# Patient Record
Sex: Female | Born: 1970 | Race: White | Marital: Single | Smoking: Former smoker
Health system: Southern US, Community
[De-identification: ages and names within clinical notes are randomized; demographics above are authoritative.]

## PROBLEM LIST (undated history)

## (undated) DIAGNOSIS — Z9889 Other specified postprocedural states: Secondary | ICD-10-CM

## (undated) DIAGNOSIS — R112 Nausea with vomiting, unspecified: Secondary | ICD-10-CM

## (undated) DIAGNOSIS — K219 Gastro-esophageal reflux disease without esophagitis: Secondary | ICD-10-CM

## (undated) DIAGNOSIS — J45909 Unspecified asthma, uncomplicated: Secondary | ICD-10-CM

## (undated) DIAGNOSIS — K5090 Crohn's disease, unspecified, without complications: Secondary | ICD-10-CM

## (undated) DIAGNOSIS — K5792 Diverticulitis of intestine, part unspecified, without perforation or abscess without bleeding: Secondary | ICD-10-CM

## (undated) DIAGNOSIS — E119 Type 2 diabetes mellitus without complications: Secondary | ICD-10-CM

## (undated) DIAGNOSIS — I10 Essential (primary) hypertension: Secondary | ICD-10-CM

## (undated) HISTORY — PX: APPENDECTOMY: SHX54

## (undated) HISTORY — PX: CHOLECYSTECTOMY: SHX55

## (undated) HISTORY — DX: Type 2 diabetes mellitus without complications: E11.9

## (undated) HISTORY — DX: Diverticulitis of intestine, part unspecified, without perforation or abscess without bleeding: K57.92

## (undated) HISTORY — PX: TONSILLECTOMY: SUR1361

## (undated) HISTORY — DX: Gastro-esophageal reflux disease without esophagitis: K21.9

## (undated) HISTORY — DX: Crohn's disease, unspecified, without complications: K50.90

---

## 2012-06-06 DIAGNOSIS — J309 Allergic rhinitis, unspecified: Secondary | ICD-10-CM | POA: Insufficient documentation

## 2012-09-09 ENCOUNTER — Ambulatory Visit: Payer: Self-pay | Admitting: Family Medicine

## 2012-10-07 DIAGNOSIS — K5732 Diverticulitis of large intestine without perforation or abscess without bleeding: Secondary | ICD-10-CM | POA: Insufficient documentation

## 2012-12-13 ENCOUNTER — Other Ambulatory Visit: Payer: Self-pay | Admitting: Gastroenterology

## 2012-12-13 DIAGNOSIS — K5000 Crohn's disease of small intestine without complications: Secondary | ICD-10-CM

## 2012-12-22 ENCOUNTER — Other Ambulatory Visit: Payer: Self-pay

## 2012-12-22 ENCOUNTER — Ambulatory Visit
Admission: RE | Admit: 2012-12-22 | Discharge: 2012-12-22 | Disposition: A | Discharge status: Home / Self Care | Payer: Managed Care, Other (non HMO) | Source: Ambulatory Visit | Attending: Gastroenterology | Admitting: Gastroenterology

## 2012-12-22 DIAGNOSIS — K5000 Crohn's disease of small intestine without complications: Secondary | ICD-10-CM

## 2012-12-22 MED ORDER — IOHEXOL 300 MG/ML  SOLN
100.0000 mL | Freq: Once | INTRAMUSCULAR | Status: AC | PRN
  Administered 2012-12-22: 100 mL via INTRAVENOUS

## 2013-01-02 ENCOUNTER — Ambulatory Visit (INDEPENDENT_AMBULATORY_CARE_PROVIDER_SITE_OTHER): Payer: Managed Care, Other (non HMO) | Admitting: Family Medicine

## 2013-01-02 ENCOUNTER — Encounter: Payer: Self-pay | Admitting: Family Medicine

## 2013-01-02 VITALS — BP 140/95 | HR 78 | Ht 67.0 in | Wt 200.0 lb

## 2013-01-02 DIAGNOSIS — K5732 Diverticulitis of large intestine without perforation or abscess without bleeding: Secondary | ICD-10-CM

## 2013-01-02 DIAGNOSIS — T8332XA Displacement of intrauterine contraceptive device, initial encounter: Secondary | ICD-10-CM | POA: Insufficient documentation

## 2013-01-02 DIAGNOSIS — K5090 Crohn's disease, unspecified, without complications: Secondary | ICD-10-CM | POA: Insufficient documentation

## 2013-01-02 DIAGNOSIS — T8332XS Displacement of intrauterine contraceptive device, sequela: Secondary | ICD-10-CM

## 2013-01-02 DIAGNOSIS — T889XXS Complication of surgical and medical care, unspecified, sequela: Secondary | ICD-10-CM

## 2013-01-02 NOTE — Assessment & Plan Note (Signed)
Will leave in place, as it is working for her and she has no complaints.  Replace with pap in 2 years.

## 2013-01-02 NOTE — Patient Instructions (Signed)
Levonorgestrel intrauterine device (IUD) What is this medicine? LEVONORGESTREL IUD (LEE voe nor jes trel) is a contraceptive (birth control) device. The device is placed inside the uterus by a healthcare professional. It is used to prevent pregnancy and can also be used to treat heavy bleeding that occurs during your period. Depending on the device, it can be used for 3 to 5 years. This medicine may be used for other purposes; ask your health care provider or pharmacist if you have questions. COMMON BRAND NAME(S): Mirena, Skyla What should I tell my health care provider before I take this medicine? They need to know if you have any of these conditions: -abnormal Pap smear -cancer of the breast, uterus, or cervix -diabetes -endometritis -genital or pelvic infection now or in the past -have more than one sexual partner or your partner has more than one partner -heart disease -history of an ectopic or tubal pregnancy -immune system problems -IUD in place -liver disease or tumor -problems with blood clots or take blood-thinners -use intravenous drugs -uterus of unusual shape -vaginal bleeding that has not been explained -an unusual or allergic reaction to levonorgestrel, other hormones, silicone, or polyethylene, medicines, foods, dyes, or preservatives -pregnant or trying to get pregnant -breast-feeding How should I use this medicine? This device is placed inside the uterus by a health care professional. Talk to your pediatrician regarding the use of this medicine in children. Special care may be needed. Overdosage: If you think you have taken too much of this medicine contact a poison control center or emergency room at once. NOTE: This medicine is only for you. Do not share this medicine with others. What if I miss a dose? This does not apply. What may interact with this medicine? Do not take this medicine with any of the following  medications: -amprenavir -bosentan -fosamprenavir This medicine may also interact with the following medications: -aprepitant -barbiturate medicines for inducing sleep or treating seizures -bexarotene -griseofulvin -medicines to treat seizures like carbamazepine, ethotoin, felbamate, oxcarbazepine, phenytoin, topiramate -modafinil -pioglitazone -rifabutin -rifampin -rifapentine -some medicines to treat HIV infection like atazanavir, indinavir, lopinavir, nelfinavir, tipranavir, ritonavir -St. John's wort -warfarin This list may not describe all possible interactions. Give your health care provider a list of all the medicines, herbs, non-prescription drugs, or dietary supplements you use. Also tell them if you smoke, drink alcohol, or use illegal drugs. Some items may interact with your medicine. What should I watch for while using this medicine? Visit your doctor or health care professional for regular check ups. See your doctor if you or your partner has sexual contact with others, becomes HIV positive, or gets a sexual transmitted disease. This product does not protect you against HIV infection (AIDS) or other sexually transmitted diseases. You can check the placement of the IUD yourself by reaching up to the top of your vagina with clean fingers to feel the threads. Do not pull on the threads. It is a good habit to check placement after each menstrual period. Call your doctor right away if you feel more of the IUD than just the threads or if you cannot feel the threads at all. The IUD may come out by itself. You may become pregnant if the device comes out. If you notice that the IUD has come out use a backup birth control method like condoms and call your health care provider. Using tampons will not change the position of the IUD and are okay to use during your period. What side effects may I   notice from receiving this medicine? Side effects that you should report to your doctor or  health care professional as soon as possible: -allergic reactions like skin rash, itching or hives, swelling of the face, lips, or tongue -fever, flu-like symptoms -genital sores -high blood pressure -no menstrual period for 6 weeks during use -pain, swelling, warmth in the leg -pelvic pain or tenderness -severe or sudden headache -signs of pregnancy -stomach cramping -sudden shortness of breath -trouble with balance, talking, or walking -unusual vaginal bleeding, discharge -yellowing of the eyes or skin Side effects that usually do not require medical attention (report to your doctor or health care professional if they continue or are bothersome): -acne -breast pain -change in sex drive or performance -changes in weight -cramping, dizziness, or faintness while the device is being inserted -headache -irregular menstrual bleeding within first 3 to 6 months of use -nausea This list may not describe all possible side effects. Call your doctor for medical advice about side effects. You may report side effects to FDA at 1-800-FDA-1088. Where should I keep my medicine? This does not apply. NOTE: This sheet is a summary. It may not cover all possible information. If you have questions about this medicine, talk to your doctor, pharmacist, or health care provider.  2014, [employer] Standard. (2011-02-05 13:54:04)  

## 2013-01-02 NOTE — Progress Notes (Signed)
   Subjective:    Patient ID: Sydney Powell, female    DOB: 1970/04/10, 42 y.o.   MRN: [id number]  HPI  New pt. Referred because she has had recent new diagnosis of crohn's disease (symtoms of new diarrhea x 6 months), improved now on Entocort.  She had CT to help confirm diagnosis.  She was found on CT to have a malpositioned IUD.  She noted no problems with IUD, prior to this.  It was placed in [place] in 2011 to help with menorrhagia.  It has, she has regular cycles but no pain.Had mammogram earlier this year.   Past Medical History  Diagnosis Date  . Crohn's disease   . Diverticulitis    Past Surgical History  Procedure Laterality Date  . Cholecystectomy    . Tonsillectomy     Family History  Problem Relation Age of Onset  . Hypertension Mother   . Diabetes Mother   . Hypothyroidism Mother   . Obesity Mother   . Diabetes Maternal Grandmother   . Cancer Maternal Grandmother     breast  . Stroke Maternal Grandmother   . Cancer Maternal Grandfather     leukemia  . Atrial fibrillation Paternal Grandmother    History   Social History  . Marital Status: Single    Spouse Name: N/A    Number of Children: N/A  . Years of Education: N/A   Occupational History  . Not on file.   Social History Main Topics  . Smoking status: Former Smoker    Quit date: 01/03/1999  . Smokeless tobacco: Never Used  . Alcohol Use: Yes     Comment: 1x/week  . Drug Use: No  . Sexual Activity: No   Other Topics Concern  . Not on file   Social History Narrative  . No narrative on file   Current outpatient prescriptions:budesonide (ENTOCORT EC) 3 MG 24 hr capsule, Take 9 mg by mouth daily., Disp: , Rfl: ;  ranitidine (ZANTAC) 150 MG tablet, Take 150 mg by mouth 2 (two) times daily., Disp: , Rfl: ;  omeprazole (PRILOSEC) 20 MG capsule, , Disp: , Rfl:    No Known Allergies     Review of Systems  Respiratory: Negative for shortness of breath.   Cardiovascular: Negative for chest pain.    Gastrointestinal: Negative for vomiting, abdominal pain and diarrhea (much improved on Entocort).  Genitourinary: Negative for dysuria, vaginal bleeding, vaginal discharge, vaginal pain, menstrual problem and pelvic pain.  Musculoskeletal: Negative for back pain.  Skin: Negative for pallor.       Objective:   Physical Exam  Vitals reviewed. Constitutional: She appears well-developed and well-nourished. No distress.  HENT:  Head: Normocephalic and atraumatic.  Eyes: No scleral icterus.  Neck: Neck supple.  Cardiovascular: Normal rate and regular rhythm.   No murmur heard. Pulmonary/Chest: Effort normal.  Abdominal: Soft. She exhibits no distension. There is no tenderness.  Genitourinary: Vagina normal and uterus normal.  IUD strings visualized and uterus is non-tender.  No adnexal mass or tenderness.          Assessment & Plan:

## 2013-04-07 DIAGNOSIS — K829 Disease of gallbladder, unspecified: Secondary | ICD-10-CM | POA: Insufficient documentation

## 2013-04-07 DIAGNOSIS — K219 Gastro-esophageal reflux disease without esophagitis: Secondary | ICD-10-CM

## 2013-04-07 HISTORY — DX: Gastro-esophageal reflux disease without esophagitis: K21.9

## 2013-04-13 DIAGNOSIS — R7303 Prediabetes: Secondary | ICD-10-CM | POA: Insufficient documentation

## 2013-08-01 DIAGNOSIS — E119 Type 2 diabetes mellitus without complications: Secondary | ICD-10-CM | POA: Insufficient documentation

## 2013-11-20 ENCOUNTER — Encounter: Payer: Self-pay | Admitting: Family Medicine

## 2014-03-27 ENCOUNTER — Ambulatory Visit (INDEPENDENT_AMBULATORY_CARE_PROVIDER_SITE_OTHER): Payer: BLUE CROSS/BLUE SHIELD | Admitting: Family Medicine

## 2014-03-27 ENCOUNTER — Encounter: Payer: Self-pay | Admitting: Family Medicine

## 2014-03-27 VITALS — BP 150/103 | HR 70 | Ht 67.0 in | Wt 214.8 lb

## 2014-03-27 DIAGNOSIS — K200 Eosinophilic esophagitis: Secondary | ICD-10-CM | POA: Insufficient documentation

## 2014-03-27 DIAGNOSIS — Z30433 Encounter for removal and reinsertion of intrauterine contraceptive device: Secondary | ICD-10-CM | POA: Diagnosis not present

## 2014-03-27 DIAGNOSIS — K5090 Crohn's disease, unspecified, without complications: Secondary | ICD-10-CM | POA: Insufficient documentation

## 2014-03-27 DIAGNOSIS — K589 Irritable bowel syndrome without diarrhea: Secondary | ICD-10-CM | POA: Insufficient documentation

## 2014-03-27 NOTE — Patient Instructions (Signed)
Levonorgestrel intrauterine device (IUD) What is this medicine? LEVONORGESTREL IUD (LEE voe nor jes trel) is a contraceptive (birth control) device. The device is placed inside the uterus by a healthcare professional. It is used to prevent pregnancy and can also be used to treat heavy bleeding that occurs during your period. Depending on the device, it can be used for 3 to 5 years. This medicine may be used for other purposes; ask your health care provider or pharmacist if you have questions. COMMON BRAND NAME(S): Verda Cumins What should I tell my health care provider before I take this medicine? They need to know if you have any of these conditions: -abnormal Pap smear -cancer of the breast, uterus, or cervix -diabetes -endometritis -genital or pelvic infection now or in the past -have more than one sexual partner or your partner has more than one partner -heart disease -history of an ectopic or tubal pregnancy -immune system problems -IUD in place -liver disease or tumor -problems with blood clots or take blood-thinners -use intravenous drugs -uterus of unusual shape -vaginal bleeding that has not been explained -an unusual or allergic reaction to levonorgestrel, other hormones, silicone, or polyethylene, medicines, foods, dyes, or preservatives -pregnant or trying to get pregnant -breast-feeding How should I use this medicine? This device is placed inside the uterus by a health care professional. Talk to your pediatrician regarding the use of this medicine in children. Special care may be needed. Overdosage: If you think you have taken too much of this medicine contact a poison control center or emergency room at once. NOTE: This medicine is only for you. Do not share this medicine with others. What if I miss a dose? This does not apply. What may interact with this medicine? Do not take this medicine with any of the following  medications: -amprenavir -bosentan -fosamprenavir This medicine may also interact with the following medications: -aprepitant -barbiturate medicines for inducing sleep or treating seizures -bexarotene -griseofulvin -medicines to treat seizures like carbamazepine, ethotoin, felbamate, oxcarbazepine, phenytoin, topiramate -modafinil -pioglitazone -rifabutin -rifampin -rifapentine -some medicines to treat HIV infection like atazanavir, indinavir, lopinavir, nelfinavir, tipranavir, ritonavir -St. John's wort -warfarin This list may not describe all possible interactions. Give your health care provider a list of all the medicines, herbs, non-prescription drugs, or dietary supplements you use. Also tell them if you smoke, drink alcohol, or use illegal drugs. Some items may interact with your medicine. What should I watch for while using this medicine? Visit your doctor or health care professional for regular check ups. See your doctor if you or your partner has sexual contact with others, becomes HIV positive, or gets a sexual transmitted disease. This product does not protect you against HIV infection (AIDS) or other sexually transmitted diseases. You can check the placement of the IUD yourself by reaching up to the top of your vagina with clean fingers to feel the threads. Do not pull on the threads. It is a good habit to check placement after each menstrual period. Call your doctor right away if you feel more of the IUD than just the threads or if you cannot feel the threads at all. The IUD may come out by itself. You may become pregnant if the device comes out. If you notice that the IUD has come out use a backup birth control method like condoms and call your health care provider. Using tampons will not change the position of the IUD and are okay to use during your period. What side effects may  I notice from receiving this medicine? Side effects that you should report to your doctor or  health care professional as soon as possible: -allergic reactions like skin rash, itching or hives, swelling of the face, lips, or tongue -fever, flu-like symptoms -genital sores -high blood pressure -no menstrual period for 6 weeks during use -pain, swelling, warmth in the leg -pelvic pain or tenderness -severe or sudden headache -signs of pregnancy -stomach cramping -sudden shortness of breath -trouble with balance, talking, or walking -unusual vaginal bleeding, discharge -yellowing of the eyes or skin Side effects that usually do not require medical attention (report to your doctor or health care professional if they continue or are bothersome): -acne -breast pain -change in sex drive or performance -changes in weight -cramping, dizziness, or faintness while the device is being inserted -headache -irregular menstrual bleeding within first 3 to 6 months of use -nausea This list may not describe all possible side effects. Call your doctor for medical advice about side effects. You may report side effects to FDA at 1-800-FDA-1088. Where should I keep my medicine? This does not apply. NOTE: This sheet is a summary. It may not cover all possible information. If you have questions about this medicine, talk to your doctor, pharmacist, or health care provider.  2015, [employer] Standard. (2011-02-05 13:54:04)

## 2014-03-27 NOTE — Progress Notes (Signed)
    Subjective: Patient here for IUD change.  Has been in for 5 years and due to be changed. She has lighter cycles and desires to continue Mirena IUD for contraception.  Objective: Filed Vitals:   03/27/14 0843  BP: 150/103  Pulse: 70    Patient identified, informed consent performed, signed copy in chart, time out was performed Procedure: Speculum placed inside vagina.  Cervix visualized.  Strings grasped with ring forceps.  IUD removed intact.  Procedure: Cervix visualized.  Cleaned with Betadine x 2.  Grasped anteriorly with a single tooth tenaculum.  Uterus sounded to 9 cm.  Mirena IUD placed per manufacturer's recommendations.  Strings trimmed to 3 cm.   Patient given post procedure instructions and Mirena care card with expiration date.  Patient is asked to check IUD strings periodically.  Impression: Encounter for IUD removal and reinsertion   Plan: Continue IUD for 5 years.

## 2014-05-08 ENCOUNTER — Encounter: Payer: Self-pay | Admitting: Family Medicine

## 2014-05-08 ENCOUNTER — Ambulatory Visit (INDEPENDENT_AMBULATORY_CARE_PROVIDER_SITE_OTHER): Payer: BLUE CROSS/BLUE SHIELD | Admitting: Family Medicine

## 2014-05-08 VITALS — BP 140/98 | HR 69 | Ht 67.0 in | Wt 219.6 lb

## 2014-05-08 DIAGNOSIS — Z124 Encounter for screening for malignant neoplasm of cervix: Secondary | ICD-10-CM

## 2014-05-08 DIAGNOSIS — Z1151 Encounter for screening for human papillomavirus (HPV): Secondary | ICD-10-CM | POA: Diagnosis not present

## 2014-05-08 DIAGNOSIS — Z01419 Encounter for gynecological examination (general) (routine) without abnormal findings: Secondary | ICD-10-CM

## 2014-05-08 DIAGNOSIS — Z30431 Encounter for routine checking of intrauterine contraceptive device: Secondary | ICD-10-CM

## 2014-05-08 NOTE — Progress Notes (Signed)
Patient has had spotting every day since Mirena Placement.

## 2014-05-08 NOTE — Progress Notes (Deleted)
    Subjective:    Patient ID: Sydney Powell is a 44 y.o. female presenting with Gynecologic Exam  on 05/08/2014  HPI:   Review of Systems    Objective:    BP 140/98 mmHg  Pulse 69  Ht 5' 7"  (1.702 m)  Wt 219 lb 9.6 oz (99.61 kg)  BMI 34.39 kg/m2 Physical Exam      Assessment & Plan:    No Follow-up on file.  Odarius Dines S 05/08/2014 1:56 PM

## 2014-05-08 NOTE — Patient Instructions (Signed)
Preventive Care for Adults A healthy lifestyle and preventive care can promote health and wellness. Preventive health guidelines for women include the following key practices.  A routine yearly physical is a good way to check with your health care provider about your health and preventive screening. It is a chance to share any concerns and updates on your health and to receive a thorough exam.  Visit your dentist for a routine exam and preventive care every 6 months. Brush your teeth twice a day and floss once a day. Good oral hygiene prevents tooth decay and gum disease.  The frequency of eye exams is based on your age, health, family medical history, use of contact lenses, and other factors. Follow your health care provider's recommendations for frequency of eye exams.  Eat a healthy diet. Foods like vegetables, fruits, whole grains, low-fat dairy products, and lean protein foods contain the nutrients you need without too many calories. Decrease your intake of foods high in solid fats, added sugars, and salt. Eat the right amount of calories for you.Get information about a proper diet from your health care provider, if necessary.  Regular physical exercise is one of the most important things you can do for your health. Most adults should get at least 150 minutes of moderate-intensity exercise (any activity that increases your heart rate and causes you to sweat) each week. In addition, most adults need muscle-strengthening exercises on 2 or more days a week.  Maintain a healthy weight. The body mass index (BMI) is a screening tool to identify possible weight problems. It provides an estimate of body fat based on height and weight. Your health care provider can find your BMI and can help you achieve or maintain a healthy weight.For adults 20 years and older:  A BMI below 18.5 is considered underweight.  A BMI of 18.5 to 24.9 is normal.  A BMI of 25 to 29.9 is considered overweight.  A BMI of  30 and above is considered obese.  Maintain normal blood lipids and cholesterol levels by exercising and minimizing your intake of saturated fat. Eat a balanced diet with plenty of fruit and vegetables. Blood tests for lipids and cholesterol should begin at age 76 and be repeated every 5 years. If your lipid or cholesterol levels are high, you are over 50, or you are at high risk for heart disease, you may need your cholesterol levels checked more frequently.Ongoing high lipid and cholesterol levels should be treated with medicines if diet and exercise are not working.  If you smoke, find out from your health care provider how to quit. If you do not use tobacco, do not start.  Lung cancer screening is recommended for adults aged 22-80 years who are at high risk for developing lung cancer because of a history of smoking. A yearly low-dose CT scan of the lungs is recommended for people who have at least a 30-pack-year history of smoking and are a current smoker or have quit within the past 15 years. A pack year of smoking is smoking an average of 1 pack of cigarettes a day for 1 year (for example: 1 pack a day for 30 years or 2 packs a day for 15 years). Yearly screening should continue until the smoker has stopped smoking for at least 15 years. Yearly screening should be stopped for people who develop a health problem that would prevent them from having lung cancer treatment.  If you are pregnant, do not drink alcohol. If you are breastfeeding,  be very cautious about drinking alcohol. If you are not pregnant and choose to drink alcohol, do not have more than 1 drink per day. One drink is considered to be 12 ounces (355 mL) of beer, 5 ounces (148 mL) of wine, or 1.5 ounces (44 mL) of liquor.  Avoid use of street drugs. Do not share needles with anyone. Ask for help if you need support or instructions about stopping the use of drugs.  High blood pressure causes heart disease and increases the risk of  stroke. Your blood pressure should be checked at least every 1 to 2 years. Ongoing high blood pressure should be treated with medicines if weight loss and exercise do not work.  If you are 3-86 years old, ask your health care provider if you should take aspirin to prevent strokes.  Diabetes screening involves taking a blood sample to check your fasting blood sugar level. This should be done once every 3 years, after age 67, if you are within normal weight and without risk factors for diabetes. Testing should be considered at a younger age or be carried out more frequently if you are overweight and have at least 1 risk factor for diabetes.  Breast cancer screening is essential preventive care for women. You should practice "breast self-awareness." This means understanding the normal appearance and feel of your breasts and may include breast self-examination. Any changes detected, no matter how small, should be reported to a health care provider. Women in their 8s and 30s should have a clinical breast exam (CBE) by a health care provider as part of a regular health exam every 1 to 3 years. After age 70, women should have a CBE every year. Starting at age 25, women should consider having a mammogram (breast X-ray test) every year. Women who have a family history of breast cancer should talk to their health care provider about genetic screening. Women at a high risk of breast cancer should talk to their health care providers about having an MRI and a mammogram every year.  Breast cancer gene (BRCA)-related cancer risk assessment is recommended for women who have family members with BRCA-related cancers. BRCA-related cancers include breast, ovarian, tubal, and peritoneal cancers. Having family members with these cancers may be associated with an increased risk for harmful changes (mutations) in the breast cancer genes BRCA1 and BRCA2. Results of the assessment will determine the need for genetic counseling and  BRCA1 and BRCA2 testing.  Routine pelvic exams to screen for cancer are no longer recommended for nonpregnant women who are considered low risk for cancer of the pelvic organs (ovaries, uterus, and vagina) and who do not have symptoms. Ask your health care provider if a screening pelvic exam is right for you.  If you have had past treatment for cervical cancer or a condition that could lead to cancer, you need Pap tests and screening for cancer for at least 20 years after your treatment. If Pap tests have been discontinued, your risk factors (such as having a new sexual partner) need to be reassessed to determine if screening should be resumed. Some women have medical problems that increase the chance of getting cervical cancer. In these cases, your health care provider may recommend more frequent screening and Pap tests.  The HPV test is an additional test that may be used for cervical cancer screening. The HPV test looks for the virus that can cause the cell changes on the cervix. The cells collected during the Pap test can be  tested for HPV. The HPV test could be used to screen women aged 30 years and older, and should be used in women of any age who have unclear Pap test results. After the age of 30, women should have HPV testing at the same frequency as a Pap test.  Colorectal cancer can be detected and often prevented. Most routine colorectal cancer screening begins at the age of 50 years and continues through age 75 years. However, your health care provider may recommend screening at an earlier age if you have risk factors for colon cancer. On a yearly basis, your health care provider may provide home test kits to check for hidden blood in the stool. Use of a small camera at the end of a tube, to directly examine the colon (sigmoidoscopy or colonoscopy), can detect the earliest forms of colorectal cancer. Talk to your health care provider about this at age 50, when routine screening begins. Direct  exam of the colon should be repeated every 5-10 years through age 75 years, unless early forms of pre-cancerous polyps or small growths are found.  People who are at an increased risk for hepatitis B should be screened for this virus. You are considered at high risk for hepatitis B if:  You were born in a country where hepatitis B occurs often. Talk with your health care provider about which countries are considered high risk.  Your parents were born in a high-risk country and you have not received a shot to protect against hepatitis B (hepatitis B vaccine).  You have HIV or AIDS.  You use needles to inject street drugs.  You live with, or have sex with, someone who has hepatitis B.  You get hemodialysis treatment.  You take certain medicines for conditions like cancer, organ transplantation, and autoimmune conditions.  Hepatitis C blood testing is recommended for all people born from 1945 through 1965 and any individual with known risks for hepatitis C.  Practice safe sex. Use condoms and avoid high-risk sexual practices to reduce the spread of sexually transmitted infections (STIs). STIs include gonorrhea, chlamydia, syphilis, trichomonas, herpes, HPV, and human immunodeficiency virus (HIV). Herpes, HIV, and HPV are viral illnesses that have no cure. They can result in disability, cancer, and death.  You should be screened for sexually transmitted illnesses (STIs) including gonorrhea and chlamydia if:  You are sexually active and are younger than 24 years.  You are older than 24 years and your health care provider tells you that you are at risk for this type of infection.  Your sexual activity has changed since you were last screened and you are at an increased risk for chlamydia or gonorrhea. Ask your health care provider if you are at risk.  If you are at risk of being infected with HIV, it is recommended that you take a prescription medicine daily to prevent HIV infection. This is  called preexposure prophylaxis (PrEP). You are considered at risk if:  You are a heterosexual woman, are sexually active, and are at increased risk for HIV infection.  You take drugs by injection.  You are sexually active with a partner who has HIV.  Talk with your health care provider about whether you are at high risk of being infected with HIV. If you choose to begin PrEP, you should first be tested for HIV. You should then be tested every 3 months for as long as you are taking PrEP.  Osteoporosis is a disease in which the bones lose minerals and strength   with aging. This can result in serious bone fractures or breaks. The risk of osteoporosis can be identified using a bone density scan. Women ages 65 years and over and women at risk for fractures or osteoporosis should discuss screening with their health care providers. Ask your health care provider whether you should take a calcium supplement or vitamin D to reduce the rate of osteoporosis.  Menopause can be associated with physical symptoms and risks. Hormone replacement therapy is available to decrease symptoms and risks. You should talk to your health care provider about whether hormone replacement therapy is right for you.  Use sunscreen. Apply sunscreen liberally and repeatedly throughout the day. You should seek shade when your shadow is shorter than you. Protect yourself by wearing long sleeves, pants, a wide-brimmed hat, and sunglasses year round, whenever you are outdoors.  Once a month, do a whole body skin exam, using a mirror to look at the skin on your back. Tell your health care provider of new moles, moles that have irregular borders, moles that are larger than a pencil eraser, or moles that have changed in shape or color.  Stay current with required vaccines (immunizations).  Influenza vaccine. All adults should be immunized every year.  Tetanus, diphtheria, and acellular pertussis (Td, Tdap) vaccine. Pregnant women should  receive 1 dose of Tdap vaccine during each pregnancy. The dose should be obtained regardless of the length of time since the last dose. Immunization is preferred during the 27th-36th week of gestation. An adult who has not previously received Tdap or who does not know her vaccine status should receive 1 dose of Tdap. This initial dose should be followed by tetanus and diphtheria toxoids (Td) booster doses every 10 years. Adults with an unknown or incomplete history of completing a 3-dose immunization series with Td-containing vaccines should begin or complete a primary immunization series including a Tdap dose. Adults should receive a Td booster every 10 years.  Varicella vaccine. An adult without evidence of immunity to varicella should receive 2 doses or a second dose if she has previously received 1 dose. Pregnant females who do not have evidence of immunity should receive the first dose after pregnancy. This first dose should be obtained before leaving the health care facility. The second dose should be obtained 4-8 weeks after the first dose.  Human papillomavirus (HPV) vaccine. Females aged 13-26 years who have not received the vaccine previously should obtain the 3-dose series. The vaccine is not recommended for use in pregnant females. However, pregnancy testing is not needed before receiving a dose. If a female is found to be pregnant after receiving a dose, no treatment is needed. In that case, the remaining doses should be delayed until after the pregnancy. Immunization is recommended for any person with an immunocompromised condition through the age of 26 years if she did not get any or all doses earlier. During the 3-dose series, the second dose should be obtained 4-8 weeks after the first dose. The third dose should be obtained 24 weeks after the first dose and 16 weeks after the second dose.  Zoster vaccine. One dose is recommended for adults aged 60 years or older unless certain conditions are  present.  Measles, mumps, and rubella (MMR) vaccine. Adults born before 1957 generally are considered immune to measles and mumps. Adults born in 1957 or later should have 1 or more doses of MMR vaccine unless there is a contraindication to the vaccine or there is laboratory evidence of immunity to   each of the three diseases. A routine second dose of MMR vaccine should be obtained at least 28 days after the first dose for students attending postsecondary schools, health care workers, or international travelers. People who received inactivated measles vaccine or an unknown type of measles vaccine during 1963-1967 should receive 2 doses of MMR vaccine. People who received inactivated mumps vaccine or an unknown type of mumps vaccine before 1979 and are at high risk for mumps infection should consider immunization with 2 doses of MMR vaccine. For females of childbearing age, rubella immunity should be determined. If there is no evidence of immunity, females who are not pregnant should be vaccinated. If there is no evidence of immunity, females who are pregnant should delay immunization until after pregnancy. Unvaccinated health care workers born before 1957 who lack laboratory evidence of measles, mumps, or rubella immunity or laboratory confirmation of disease should consider measles and mumps immunization with 2 doses of MMR vaccine or rubella immunization with 1 dose of MMR vaccine.  Pneumococcal 13-valent conjugate (PCV13) vaccine. When indicated, a person who is uncertain of her immunization history and has no record of immunization should receive the PCV13 vaccine. An adult aged 19 years or older who has certain medical conditions and has not been previously immunized should receive 1 dose of PCV13 vaccine. This PCV13 should be followed with a dose of pneumococcal polysaccharide (PPSV23) vaccine. The PPSV23 vaccine dose should be obtained at least 8 weeks after the dose of PCV13 vaccine. An adult aged 19  years or older who has certain medical conditions and previously received 1 or more doses of PPSV23 vaccine should receive 1 dose of PCV13. The PCV13 vaccine dose should be obtained 1 or more years after the last PPSV23 vaccine dose.  Pneumococcal polysaccharide (PPSV23) vaccine. When PCV13 is also indicated, PCV13 should be obtained first. All adults aged 65 years and older should be immunized. An adult younger than age 65 years who has certain medical conditions should be immunized. Any person who resides in a nursing home or long-term care facility should be immunized. An adult smoker should be immunized. People with an immunocompromised condition and certain other conditions should receive both PCV13 and PPSV23 vaccines. People with human immunodeficiency virus (HIV) infection should be immunized as soon as possible after diagnosis. Immunization during chemotherapy or radiation therapy should be avoided. Routine use of PPSV23 vaccine is not recommended for American Indians, [place] Natives, or people younger than 65 years unless there are medical conditions that require PPSV23 vaccine. When indicated, people who have unknown immunization and have no record of immunization should receive PPSV23 vaccine. One-time revaccination 5 years after the first dose of PPSV23 is recommended for people aged 19-64 years who have chronic kidney failure, nephrotic syndrome, asplenia, or immunocompromised conditions. People who received 1-2 doses of PPSV23 before age 65 years should receive another dose of PPSV23 vaccine at age 65 years or later if at least 5 years have passed since the previous dose. Doses of PPSV23 are not needed for people immunized with PPSV23 at or after age 65 years.  Meningococcal vaccine. Adults with asplenia or persistent complement component deficiencies should receive 2 doses of quadrivalent meningococcal conjugate (MenACWY-D) vaccine. The doses should be obtained at least 2 months apart.  Microbiologists working with certain meningococcal bacteria, military recruits, people at risk during an outbreak, and people who travel to or live in countries with a high rate of meningitis should be immunized. A first-year college student up through age   21 years who is living in a residence hall should receive a dose if she did not receive a dose on or after her 16th birthday. Adults who have certain high-risk conditions should receive one or more doses of vaccine.  Hepatitis A vaccine. Adults who wish to be protected from this disease, have certain high-risk conditions, work with hepatitis A-infected animals, work in hepatitis A research labs, or travel to or work in countries with a high rate of hepatitis A should be immunized. Adults who were previously unvaccinated and who anticipate close contact with an international adoptee during the first 60 days after arrival in the [place] from a country with a high rate of hepatitis A should be immunized.  Hepatitis B vaccine. Adults who wish to be protected from this disease, have certain high-risk conditions, may be exposed to blood or other infectious body fluids, are household contacts or sex partners of hepatitis B positive people, are clients or workers in certain care facilities, or travel to or work in countries with a high rate of hepatitis B should be immunized.  Haemophilus influenzae type b (Hib) vaccine. A previously unvaccinated person with asplenia or sickle cell disease or having a scheduled splenectomy should receive 1 dose of Hib vaccine. Regardless of previous immunization, a recipient of a hematopoietic stem cell transplant should receive a 3-dose series 6-12 months after her successful transplant. Hib vaccine is not recommended for adults with HIV infection. Preventive Services / Frequency Ages 64 to 68 years  Blood pressure check.** / Every 1 to 2 years.  Lipid and cholesterol check.** / Every 5 years beginning at age  22.  Clinical breast exam.** / Every 3 years for women in their 88s and 53s.  BRCA-related cancer risk assessment.** / For women who have family members with a BRCA-related cancer (breast, ovarian, tubal, or peritoneal cancers).  Pap test.** / Every 2 years from ages 90 through 51. Every 3 years starting at age 21 through age 56 or 3 with a history of 3 consecutive normal Pap tests.  HPV screening.** / Every 3 years from ages 24 through ages 1 to 46 with a history of 3 consecutive normal Pap tests.  Hepatitis C blood test.** / For any individual with known risks for hepatitis C.  Skin self-exam. / Monthly.  Influenza vaccine. / Every year.  Tetanus, diphtheria, and acellular pertussis (Tdap, Td) vaccine.** / Consult your health care provider. Pregnant women should receive 1 dose of Tdap vaccine during each pregnancy. 1 dose of Td every 10 years.  Varicella vaccine.** / Consult your health care provider. Pregnant females who do not have evidence of immunity should receive the first dose after pregnancy.  HPV vaccine. / 3 doses over 6 months, if 72 and younger. The vaccine is not recommended for use in pregnant females. However, pregnancy testing is not needed before receiving a dose.  Measles, mumps, rubella (MMR) vaccine.** / You need at least 1 dose of MMR if you were born in 1957 or later. You may also need a 2nd dose. For females of childbearing age, rubella immunity should be determined. If there is no evidence of immunity, females who are not pregnant should be vaccinated. If there is no evidence of immunity, females who are pregnant should delay immunization until after pregnancy.  Pneumococcal 13-valent conjugate (PCV13) vaccine.** / Consult your health care provider.  Pneumococcal polysaccharide (PPSV23) vaccine.** / 1 to 2 doses if you smoke cigarettes or if you have certain conditions.  Meningococcal vaccine.** /  1 dose if you are age 19 to 21 years and a first-year college  student living in a residence hall, or have one of several medical conditions, you need to get vaccinated against meningococcal disease. You may also need additional booster doses.  Hepatitis A vaccine.** / Consult your health care provider.  Hepatitis B vaccine.** / Consult your health care provider.  Haemophilus influenzae type b (Hib) vaccine.** / Consult your health care provider. Ages 40 to 64 years  Blood pressure check.** / Every 1 to 2 years.  Lipid and cholesterol check.** / Every 5 years beginning at age 20 years.  Lung cancer screening. / Every year if you are aged 55-80 years and have a 30-pack-year history of smoking and currently smoke or have quit within the past 15 years. Yearly screening is stopped once you have quit smoking for at least 15 years or develop a health problem that would prevent you from having lung cancer treatment.  Clinical breast exam.** / Every year after age 40 years.  BRCA-related cancer risk assessment.** / For women who have family members with a BRCA-related cancer (breast, ovarian, tubal, or peritoneal cancers).  Mammogram.** / Every year beginning at age 40 years and continuing for as long as you are in good health. Consult with your health care provider.  Pap test.** / Every 3 years starting at age 30 years through age 65 or 70 years with a history of 3 consecutive normal Pap tests.  HPV screening.** / Every 3 years from ages 30 years through ages 65 to 70 years with a history of 3 consecutive normal Pap tests.  Fecal occult blood test (FOBT) of stool. / Every year beginning at age 50 years and continuing until age 75 years. You may not need to do this test if you get a colonoscopy every 10 years.  Flexible sigmoidoscopy or colonoscopy.** / Every 5 years for a flexible sigmoidoscopy or every 10 years for a colonoscopy beginning at age 50 years and continuing until age 75 years.  Hepatitis C blood test.** / For all people born from 1945 through  1965 and any individual with known risks for hepatitis C.  Skin self-exam. / Monthly.  Influenza vaccine. / Every year.  Tetanus, diphtheria, and acellular pertussis (Tdap/Td) vaccine.** / Consult your health care provider. Pregnant women should receive 1 dose of Tdap vaccine during each pregnancy. 1 dose of Td every 10 years.  Varicella vaccine.** / Consult your health care provider. Pregnant females who do not have evidence of immunity should receive the first dose after pregnancy.  Zoster vaccine.** / 1 dose for adults aged 60 years or older.  Measles, mumps, rubella (MMR) vaccine.** / You need at least 1 dose of MMR if you were born in 1957 or later. You may also need a 2nd dose. For females of childbearing age, rubella immunity should be determined. If there is no evidence of immunity, females who are not pregnant should be vaccinated. If there is no evidence of immunity, females who are pregnant should delay immunization until after pregnancy.  Pneumococcal 13-valent conjugate (PCV13) vaccine.** / Consult your health care provider.  Pneumococcal polysaccharide (PPSV23) vaccine.** / 1 to 2 doses if you smoke cigarettes or if you have certain conditions.  Meningococcal vaccine.** / Consult your health care provider.  Hepatitis A vaccine.** / Consult your health care provider.  Hepatitis B vaccine.** / Consult your health care provider.  Haemophilus influenzae type b (Hib) vaccine.** / Consult your health care provider. Ages 65   years and over  Blood pressure check.** / Every 1 to 2 years.  Lipid and cholesterol check.** / Every 5 years beginning at age 22 years.  Lung cancer screening. / Every year if you are aged 73-80 years and have a 30-pack-year history of smoking and currently smoke or have quit within the past 15 years. Yearly screening is stopped once you have quit smoking for at least 15 years or develop a health problem that would prevent you from having lung cancer  treatment.  Clinical breast exam.** / Every year after age 4 years.  BRCA-related cancer risk assessment.** / For women who have family members with a BRCA-related cancer (breast, ovarian, tubal, or peritoneal cancers).  Mammogram.** / Every year beginning at age 40 years and continuing for as long as you are in good health. Consult with your health care provider.  Pap test.** / Every 3 years starting at age 9 years through age 34 or 91 years with 3 consecutive normal Pap tests. Testing can be stopped between 65 and 70 years with 3 consecutive normal Pap tests and no abnormal Pap or HPV tests in the past 10 years.  HPV screening.** / Every 3 years from ages 57 years through ages 64 or 45 years with a history of 3 consecutive normal Pap tests. Testing can be stopped between 65 and 70 years with 3 consecutive normal Pap tests and no abnormal Pap or HPV tests in the past 10 years.  Fecal occult blood test (FOBT) of stool. / Every year beginning at age 15 years and continuing until age 17 years. You may not need to do this test if you get a colonoscopy every 10 years.  Flexible sigmoidoscopy or colonoscopy.** / Every 5 years for a flexible sigmoidoscopy or every 10 years for a colonoscopy beginning at age 86 years and continuing until age 71 years.  Hepatitis C blood test.** / For all people born from [birth year] through 1964 and any individual with known risks for hepatitis C.  Osteoporosis screening.** / A one-time screening for women ages 83 years and over and women at risk for fractures or osteoporosis.  Skin self-exam. / Monthly.  Influenza vaccine. / Every year.  Tetanus, diphtheria, and acellular pertussis (Tdap/Td) vaccine.** / 1 dose of Td every 10 years.  Varicella vaccine.** / Consult your health care provider.  Zoster vaccine.** / 1 dose for adults aged 61 years or older.  Pneumococcal 13-valent conjugate (PCV13) vaccine.** / Consult your health care provider.  Pneumococcal  polysaccharide (PPSV23) vaccine.** / 1 dose for all adults aged 28 years and older.  Meningococcal vaccine.** / Consult your health care provider.  Hepatitis A vaccine.** / Consult your health care provider.  Hepatitis B vaccine.** / Consult your health care provider.  Haemophilus influenzae type b (Hib) vaccine.** / Consult your health care provider. ** Family history and personal history of risk and conditions may change your health care provider's recommendations. Document Released: 03/03/2001 Document Revised: 05/22/2013 Document Reviewed: 06/02/2010 [facility] Patient Information 2015 [place], [place]. This information is not intended to replace advice given to you by your health care provider. Make sure you discuss any questions you have with your health care provider.

## 2014-05-08 NOTE — Progress Notes (Signed)
  Subjective:     Sydney Powell is a 44 y.o. female and is here for a comprehensive physical exam. The patient reports problems - spotting since IUD change.  Has h/o Crohn's disease--on numerous meds but no acitve colitis symptoms right now.  Is retaining fluid related to medications..  History   Social History  . Marital Status: Single    Spouse Name: N/A  . Number of Children: N/A  . Years of Education: N/A   Occupational History  . 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 Smoking status: Former Smoker    Quit date: 01/03/1999  . Smokeless tobacco: Never Used  . Alcohol Use: 0.0 oz/week    0 Standard drinks or equivalent per week     Comment: 1x/week  . Drug Use: No  . Sexual Activity: Not Currently    Birth Control/ Protection: IUD   Other Topics Concern  . Not on file   Social History Narrative   Health Maintenance  Topic Date Due  . HEMOGLOBIN A1C  04-27-71  . PNEUMOCOCCAL POLYSACCHARIDE VACCINE (1) 10/22/1973  . FOOT EXAM  10/22/1981  . OPHTHALMOLOGY EXAM  10/22/1981  . URINE MICROALBUMIN  10/22/1981  . HIV Screening  10/23/1986  . PAP SMEAR  10/22/1989  . TETANUS/TDAP  10/23/1990  . INFLUENZA VACCINE  08/20/2014    The following portions of the patient's history were reviewed and updated as appropriate: allergies, current medications, past family history, past medical history, past social history, past surgical history and problem list.  Review of Systems Pertinent items are noted in HPI.   Objective:    BP 140/98 mmHg  Pulse 69  Ht 5' 7"  (1.702 m)  Wt 219 lb 9.6 oz (99.61 kg)  BMI 34.39 kg/m2 General appearance: alert, cooperative and appears stated age Head: Normocephalic, without obvious abnormality, atraumatic Neck: no adenopathy, supple, symmetrical, trachea midline and thyroid not enlarged, symmetric, no tenderness/mass/nodules Lungs: clear to auscultation bilaterally Breasts: normal  appearance, no masses or tenderness Heart: regular rate and rhythm, S1, S2 normal, no murmur, click, rub or gallop Abdomen: soft, non-tender; bowel sounds normal; no masses,  no organomegaly Pelvic: cervix normal in appearance, external genitalia normal, no adnexal masses or tenderness, no cervical motion tenderness, uterus normal size, shape, and consistency, vagina normal without discharge and IUD strings visualized Extremities: extremities normal, atraumatic, no cyanosis or edema Pulses: 2+ and symmetric Skin: Skin color, texture, turgor normal. No rashes or lesions Lymph nodes: Cervical, supraclavicular, and axillary nodes normal. Neurologic: Grossly normal    Assessment:    Healthy female exam.      Plan:   Problem List Items Addressed This Visit    None    Visit Diagnoses    Encounter for routine gynecological examination    -  Primary    Relevant Orders    Cytology - PAP    Screening for malignant neoplasm of cervix        Relevant Orders    Cytology - PAP     Yearly labs and mammogram through Primary MD.    See After Visit Summary for Counseling Recommendations

## 2014-05-09 LAB — CYTOLOGY - PAP

## 2016-02-14 ENCOUNTER — Other Ambulatory Visit
Admission: RE | Admit: 2016-02-14 | Discharge: 2016-02-14 | Disposition: A | Discharge status: Home / Self Care | Payer: BLUE CROSS/BLUE SHIELD | Source: Ambulatory Visit | Attending: Student | Admitting: Student

## 2016-02-14 DIAGNOSIS — K50018 Crohn's disease of small intestine with other complication: Secondary | ICD-10-CM | POA: Insufficient documentation

## 2016-02-14 LAB — C DIFFICILE QUICK SCREEN W PCR REFLEX
C DIFFICILE (CDIFF) INTERP: NOT DETECTED
C DIFFICILE (CDIFF) TOXIN: NEGATIVE
C DIFFICLE (CDIFF) ANTIGEN: NEGATIVE

## 2016-02-14 LAB — GASTROINTESTINAL PANEL BY PCR, STOOL (REPLACES STOOL CULTURE)
ADENOVIRUS F40/41: NOT DETECTED
Astrovirus: NOT DETECTED
CAMPYLOBACTER SPECIES: NOT DETECTED
CRYPTOSPORIDIUM: NOT DETECTED
Cyclospora cayetanensis: NOT DETECTED
ENTEROAGGREGATIVE E COLI (EAEC): NOT DETECTED
ENTEROPATHOGENIC E COLI (EPEC): NOT DETECTED
Entamoeba histolytica: NOT DETECTED
Enterotoxigenic E coli (ETEC): NOT DETECTED
GIARDIA LAMBLIA: NOT DETECTED
NOROVIRUS GI/GII: NOT DETECTED
PLESIMONAS SHIGELLOIDES: NOT DETECTED
ROTAVIRUS A: NOT DETECTED
SHIGELLA/ENTEROINVASIVE E COLI (EIEC): NOT DETECTED
Salmonella species: NOT DETECTED
Sapovirus (I, II, IV, and V): NOT DETECTED
Shiga like toxin producing E coli (STEC): NOT DETECTED
Vibrio cholerae: NOT DETECTED
Vibrio species: NOT DETECTED
YERSINIA ENTEROCOLITICA: NOT DETECTED

## 2016-02-19 LAB — MISC LABCORP TEST (SEND OUT)
LABCORP TEST CODE: 123255
LABCORP TEST NAME: 123255

## 2017-03-11 ENCOUNTER — Ambulatory Visit (INDEPENDENT_AMBULATORY_CARE_PROVIDER_SITE_OTHER): Payer: BLUE CROSS/BLUE SHIELD | Admitting: Family Medicine

## 2017-03-11 ENCOUNTER — Encounter: Payer: Self-pay | Admitting: Family Medicine

## 2017-03-11 DIAGNOSIS — Z124 Encounter for screening for malignant neoplasm of cervix: Secondary | ICD-10-CM

## 2017-03-11 DIAGNOSIS — Z1151 Encounter for screening for human papillomavirus (HPV): Secondary | ICD-10-CM | POA: Diagnosis not present

## 2017-03-11 DIAGNOSIS — Z01419 Encounter for gynecological examination (general) (routine) without abnormal findings: Secondary | ICD-10-CM | POA: Diagnosis not present

## 2017-03-11 NOTE — Patient Instructions (Signed)
Preventive Care 18-39 Years, Female Preventive care refers to lifestyle choices and visits with your health care provider that can promote health and wellness. What does preventive care include?  A yearly physical exam. This is also called an annual well check.  Dental exams once or twice a year.  Routine eye exams. Ask your health care provider how often you should have your eyes checked.  Personal lifestyle choices, including: ? Daily care of your teeth and gums. ? Regular physical activity. ? Eating a healthy diet. ? Avoiding tobacco and drug use. ? Limiting alcohol use. ? Practicing safe sex. ? Taking vitamin and mineral supplements as recommended by your health care provider. What happens during an annual well check? The services and screenings done by your health care provider during your annual well check will depend on your age, overall health, lifestyle risk factors, and family history of disease. Counseling Your health care provider may ask you questions about your:  Alcohol use.  Tobacco use.  Drug use.  Emotional well-being.  Home and relationship well-being.  Sexual activity.  Eating habits.  Work and work Statistician.  Method of birth control.  Menstrual cycle.  Pregnancy history.  Screening You may have the following tests or measurements:  Height, weight, and BMI.  Diabetes screening. This is done by checking your blood sugar (glucose) after you have not eaten for a while (fasting).  Blood pressure.  Lipid and cholesterol levels. These may be checked every 5 years starting at age 38.  Skin check.  Hepatitis C blood test.  Hepatitis B blood test.  Sexually transmitted disease (STD) testing.  BRCA-related cancer screening. This may be done if you have a family history of breast, ovarian, tubal, or peritoneal cancers.  Pelvic exam and Pap test. This may be done every 3 years starting at age 38. Starting at age 30, this may be done  every 5 years if you have a Pap test in combination with an HPV test.  Discuss your test results, treatment options, and if necessary, the need for more tests with your health care provider. Vaccines Your health care provider may recommend certain vaccines, such as:  Influenza vaccine. This is recommended every year.  Tetanus, diphtheria, and acellular pertussis (Tdap, Td) vaccine. You may need a Td booster every 10 years.  Varicella vaccine. You may need this if you have not been vaccinated.  HPV vaccine. If you are 39 or younger, you may need three doses over 6 months.  Measles, mumps, and rubella (MMR) vaccine. You may need at least one dose of MMR. You may also need a second dose.  Pneumococcal 13-valent conjugate (PCV13) vaccine. You may need this if you have certain conditions and were not previously vaccinated.  Pneumococcal polysaccharide (PPSV23) vaccine. You may need one or two doses if you smoke cigarettes or if you have certain conditions.  Meningococcal vaccine. One dose is recommended if you are age 68-21 years and a first-year college student living in a residence hall, or if you have one of several medical conditions. You may also need additional booster doses.  Hepatitis A vaccine. You may need this if you have certain conditions or if you travel or work in places where you may be exposed to hepatitis A.  Hepatitis B vaccine. You may need this if you have certain conditions or if you travel or work in places where you may be exposed to hepatitis B.  Haemophilus influenzae type b (Hib) vaccine. You may need this  if you have certain risk factors.  Talk to your health care provider about which screenings and vaccines you need and how often you need them. This information is not intended to replace advice given to you by your health care provider. Make sure you discuss any questions you have with your health care provider. Document Released: 03/03/2001 Document Revised:  09/25/2015 Document Reviewed: 11/06/2014 Elsevier Interactive Patient Education  2018 Elsevier Inc.  

## 2017-03-11 NOTE — Progress Notes (Signed)
  Subjective:     Sydney Powell is a 47 y.o. female and is here for a comprehensive physical exam. The patient reports no problems. Has IUD placed in 2016 and it is doing well. Has monthly spotting only. Crohn's is well controlled at present. New dx of HTN-on Losartan. DM well controlled as well. PCP handles labs and mammography for patient.  Social History   Socioeconomic History  . Marital status: Single    Spouse name: Not on file  . Number of children: Not on file  . Years of education: Not on file  . Highest education level: Not on file  Social Needs  . Financial resource strain: Not on file  . Food insecurity - worry: Not on file  . Food insecurity - inability: Not on file  . Transportation needs - medical: Not on file  . Transportation needs - non-medical: Not on file  Occupational History  . 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 Smoking status: Former Smoker    Last attempt to quit: 01/03/1999    Years since quitting: 18.1  . Smokeless tobacco: Never Used  Substance and Sexual Activity  . Alcohol use: Yes    Alcohol/week: 0.0 oz    Comment: 1x/week  . Drug use: No  . Sexual activity: Not Currently    Birth control/protection: IUD  Other Topics Concern  . Not on file  Social History Narrative  . Not on file   Health Maintenance  Topic Date Due  . HEMOGLOBIN A1C  09/24/1971  . PNEUMOCOCCAL POLYSACCHARIDE VACCINE (1) 10/22/1973  . FOOT EXAM  10/22/1981  . OPHTHALMOLOGY EXAM  10/22/1981  . URINE MICROALBUMIN  10/22/1981  . HIV Screening  10/23/1986  . TETANUS/TDAP  10/23/1990  . INFLUENZA VACCINE  08/19/2016  . PAP SMEAR  05/07/2017    The following portions of the patient's history were reviewed and updated as appropriate: allergies, current medications, past family history, past medical history, past social history, past surgical history and problem list.  Review of  Systems Pertinent items are noted in HPI.   Objective:    BP (!) 149/88   Pulse 78   Ht 5' 7"  (1.702 m)   Wt 217 lb (98.4 kg)   BMI 33.99 kg/m  General appearance: alert, cooperative and appears stated age Head: Normocephalic, without obvious abnormality, atraumatic Neck: no adenopathy, supple, symmetrical, trachea midline and thyroid not enlarged, symmetric, no tenderness/mass/nodules Lungs: clear to auscultation bilaterally Breasts: normal appearance, no masses or tenderness Heart: regular rate and rhythm, S1, S2 normal, no murmur, click, rub or gallop Abdomen: soft, non-tender; bowel sounds normal; no masses,  no organomegaly Pelvic: cervix normal in appearance, external genitalia normal, no adnexal masses or tenderness, no cervical motion tenderness, uterus normal size, shape, and consistency, vagina normal without discharge and IUD strings noted Extremities: extremities normal, atraumatic, no cyanosis or edema Pulses: 2+ and symmetric Skin: Skin color, texture, turgor normal. No rashes or lesions Lymph nodes: Cervical, supraclavicular, and axillary nodes normal. Neurologic: Grossly normal    Assessment:    Healthy female exam.      Plan:     Screening for malignant neoplasm of cervix - Plan: Cytology - PAP, Cervicovaginal ancillary only  Encounter for gynecological examination without abnormal finding  PCP to perform annual labs and mammogram  See After Visit Summary for Counseling Recommendations

## 2017-03-12 LAB — CERVICOVAGINAL ANCILLARY ONLY
BACTERIAL VAGINITIS: NEGATIVE
Candida vaginitis: NEGATIVE

## 2017-03-16 LAB — CYTOLOGY - PAP
Diagnosis: NEGATIVE
HPV (WINDOPATH): NOT DETECTED

## 2017-04-12 DIAGNOSIS — M5136 Other intervertebral disc degeneration, lumbar region: Secondary | ICD-10-CM | POA: Insufficient documentation

## 2017-07-01 ENCOUNTER — Other Ambulatory Visit
Admission: RE | Admit: 2017-07-01 | Discharge: 2017-07-01 | Disposition: A | Discharge status: Home / Self Care | Payer: BLUE CROSS/BLUE SHIELD | Source: Ambulatory Visit | Attending: Student | Admitting: Student

## 2017-07-01 DIAGNOSIS — K5000 Crohn's disease of small intestine without complications: Secondary | ICD-10-CM | POA: Insufficient documentation

## 2017-07-01 LAB — GASTROINTESTINAL PANEL BY PCR, STOOL (REPLACES STOOL CULTURE)
ADENOVIRUS F40/41: NOT DETECTED
Astrovirus: NOT DETECTED
CAMPYLOBACTER SPECIES: NOT DETECTED
CRYPTOSPORIDIUM: NOT DETECTED
Cyclospora cayetanensis: NOT DETECTED
ENTEROPATHOGENIC E COLI (EPEC): NOT DETECTED
Entamoeba histolytica: NOT DETECTED
Enteroaggregative E coli (EAEC): NOT DETECTED
Enterotoxigenic E coli (ETEC): NOT DETECTED
Giardia lamblia: NOT DETECTED
Norovirus GI/GII: NOT DETECTED
PLESIMONAS SHIGELLOIDES: NOT DETECTED
ROTAVIRUS A: NOT DETECTED
SALMONELLA SPECIES: NOT DETECTED
SHIGA LIKE TOXIN PRODUCING E COLI (STEC): NOT DETECTED
SHIGELLA/ENTEROINVASIVE E COLI (EIEC): NOT DETECTED
Sapovirus (I, II, IV, and V): NOT DETECTED
Vibrio cholerae: NOT DETECTED
Vibrio species: NOT DETECTED
YERSINIA ENTEROCOLITICA: NOT DETECTED

## 2017-07-01 LAB — C DIFFICILE QUICK SCREEN W PCR REFLEX
C DIFFICILE (CDIFF) INTERP: NOT DETECTED
C DIFFICILE (CDIFF) TOXIN: NEGATIVE
C Diff antigen: NEGATIVE

## 2017-07-05 LAB — CALPROTECTIN, FECAL: Calprotectin, Fecal: <16 ug/g (ref 0–120)

## 2017-09-14 ENCOUNTER — Ambulatory Visit (INDEPENDENT_AMBULATORY_CARE_PROVIDER_SITE_OTHER): Payer: BLUE CROSS/BLUE SHIELD | Admitting: Obstetrics and Gynecology

## 2017-09-14 ENCOUNTER — Encounter: Payer: Self-pay | Admitting: Obstetrics and Gynecology

## 2017-09-14 VITALS — BP 178/98 | HR 75 | Resp 18 | Ht 67.0 in | Wt 217.4 lb

## 2017-09-14 DIAGNOSIS — N939 Abnormal uterine and vaginal bleeding, unspecified: Secondary | ICD-10-CM | POA: Diagnosis not present

## 2017-09-14 DIAGNOSIS — R030 Elevated blood-pressure reading, without diagnosis of hypertension: Secondary | ICD-10-CM

## 2017-09-14 DIAGNOSIS — Z3202 Encounter for pregnancy test, result negative: Secondary | ICD-10-CM | POA: Diagnosis not present

## 2017-09-14 LAB — POCT URINE PREGNANCY: Preg Test, Ur: NEGATIVE

## 2017-09-14 MED ORDER — NORETHINDRONE ACETATE 5 MG PO TABS: ORAL_TABLET | ORAL | 1 refills | Status: DC

## 2017-09-14 NOTE — Progress Notes (Addendum)
Obstetrics and Gynecology Established Patient Evaluation  Appointment Date: 09/14/2017  OBGYN Clinic: [facility]  Primary Care Provider: No primary care provider on file.  Referring Provider: Self  Chief Complaint:  Chief Complaint  Patient presents with  . Menorrhagia    abnormal bleeding    History of Present Illness: Sydney Powell is a 47 y.o. Caucasian G2P1011 (Patient's last menstrual period was 08/22/2017.), seen for the above chief complaint. Her past medical history is significant for DM2, h/o malpositioned IUD, ?HTN.   See RN note for bleeding profile. Patient has been amenorrheic on Mirena but in June started having abnormal bleeding. Usually BRB but not usually painful. No current bleeding today. Hasn't been sexually active in 10 years. Last pap early 2019 and negative and hpv negative.    Review of Systems:  as noted in the History of Present Illness.  Past Medical History:  Past Medical History:  Diagnosis Date  . Acid reflux 04/07/2013  . Crohn's disease ([place])   . Diabetes mellitus without complication (HCC)    Type 2  . Diverticulitis     Past Surgical History:  Past Surgical History:  Procedure Laterality Date  . CHOLECYSTECTOMY    . TONSILLECTOMY      Past Obstetrical History:  OB History  Gravida Para Term Preterm AB Living  _0 SAB TAB Ectopic Multiple Live Births  1       1    # Outcome Date GA Lbr Len/2nd Weight Sex Delivery Anes PTL Lv  2 Term 02/05/99    F Vag-Spont   LIV  1 SAB             Past Gynecological History: As per HPI.  She is currently using Mirena for contraception.   Social History:  Social History   Socioeconomic History  . Marital status: Single    Spouse name: Not on file  . Number of children: Not on file  . Years of education: Not on file  . Highest education level: Not on file  Occupational History  . 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 Financial resource strain: Not on file  . Food insecurity:    Worry: Not on file    Inability: Not on file  . Transportation needs:    Medical: Not on file    Non-medical: Not on file  Tobacco Use  . Smoking status: Former Smoker    Last attempt to quit: 01/03/1999    Years since quitting: 18.7  . Smokeless tobacco: Never Used  Substance and Sexual Activity  . Alcohol use: Yes    Alcohol/week: 0.0 standard drinks    Comment: 1x/week  . Drug use: No  . Sexual activity: Not Currently    Birth control/protection: IUD  Lifestyle  . Physical activity:    Days per week: Not on file    Minutes per session: Not on file  . Stress: Not on file  Relationships  . Social connections:    Talks on phone: Not on file    Gets together: Not on file    Attends religious service: Not on file    Active member of club or organization: Not on file    Attends meetings of clubs or organizations: Not on file    Relationship status: Not on file  . Intimate partner violence:  Fear of current or ex partner: Not on file    Emotionally abused: Not on file    Physically abused: Not on file    Forced sexual activity: Not on file  Other Topics Concern  . Not on file  Social History Narrative  . Not on file    Family History:  Family History  Problem Relation Age of Onset  . Hypertension Mother   . Diabetes Mother   . Hypothyroidism Mother   . Obesity Mother   . Diabetes Maternal Grandmother   . Cancer Maternal Grandmother        breast  . Stroke Maternal Grandmother   . Cancer Maternal Grandfather        leukemia  . Atrial fibrillation Paternal Grandmother     Medications Suella Grove had no medications administered during this visit. Current Outpatient Medications  Medication Sig Dispense Refill  . acetaminophen (TYLENOL) 500 MG tablet Take 1 tablet by mouth as needed.    Marland Kitchen azathioprine (IMURAN) 100 MG tablet Take  by mouth.    . B Complex Vitamins (VITAMIN B COMPLEX PO) Take by mouth.    . Blood Glucose Monitoring Suppl (ONE TOUCH ULTRA MINI) W/DEVICE KIT     . Cetirizine HCl 10 MG CAPS Take by mouth.    . Cholecalciferol (VITAMIN D-3 PO) Take by mouth.    . doxycycline (MONODOX) 100 MG capsule Take 1 capsule by mouth daily.    Marland Kitchen GLIPIZIDE XL 2.5 MG 24 hr tablet 10 mg.     . losartan (COZAAR) 50 MG tablet Take 50 mg by mouth daily.    . ONE TOUCH ULTRA TEST test strip     . ONETOUCH DELICA LANCETS FINE MISC     . ranitidine (ZANTAC) 300 MG tablet Take by mouth.    . simvastatin (ZOCOR) 10 MG tablet Take 1 tablet by mouth daily.    Marland Kitchen albuterol (PROVENTIL HFA;VENTOLIN HFA) 108 (90 BASE) MCG/ACT inhaler Inhale into the lungs.    . norethindrone (AYGESTIN) 5 MG tablet Two tabs po q6-8h prn for heavy bleeding 45 tablet 1   No current facility-administered medications for this visit.     Allergies Montelukast sodium; Adhesive [tape]; and Phenergan [promethazine hcl]   Physical Exam:  BP (!) 178/98 (BP Location: Left Arm, Patient Position: Sitting, Cuff Size: Large)   Pulse 75   Resp 18   Ht _0  (1.702 m)   Wt 217 lb 6.4 oz (98.6 kg)   LMP 08/22/2017   BMI 34.05 kg/m  Body mass index is 34.05 kg/m. General appearance: Well nourished, well developed female in no acute distress.  Back: NTTP Cardiovascular: normal s1 and s2.  No murmurs, rubs or gallops. Respiratory:  Clear to auscultation bilateral. Normal respiratory effort Abdomen: positive bowel sounds and no masses, hernias; diffusely non tender to palpation, non distended Neuro/Psych:  Normal mood and affect.  Skin:  Warm and dry.  Lymphatic:  No inguinal lymphadenopathy.   Pelvic exam: is not limited by body habitus EGBUS: within normal limits, Vagina: scant old blood in the vault, no discharge, no active bleeding. Cervix: normal appearing cervix without tenderness, discharge or lesions. IUD strings 4-5cm Uterus:  nonenlarged and non  tender and Adnexa:  normal adnexa and no mass, fullness, tenderness Rectovaginal: deferred  Laboratory: UPT negative  Radiology: [facility] scan read reviewed ->non contributory.   Assessment: pt stable  Plan:  1. Abnormal vaginal bleeding Pelvic u/s. PRN aygestin given to patient. Follow up after  u/s. D/w her re: surgical options in case we are heading in that direction. If so, would need embx. Pt declines STI testinng - POCT urine pregnancy - US PELVIC COMPLETE WITH TRANSVAGINAL; Future  RTC 1-3wks.   Durene Romans MD Attending [facility] [employer] [facility])

## 2017-09-14 NOTE — Progress Notes (Signed)
Sydney Powell put in: 03/27/14  Patient reports break through bleeding since:  June: 06/26/17 - 06/30/17 Super+ tampon every day; small/med clots; heavy bleeding July: 07/19/17 - 07/24/2017 Super+ tampon 1-3 dys; small/med clots; heavy bleeding August: 08/22/17 - 09/12/17; 1st 2 dys were light; 3rd/4th dys - heavy; super+ tampons; small/med clots; 8th - tapered off; 9th-16th: spotting; wore panty liner; 17th - 20th: super + tampons; small/med clots; heavy bleeding; 21st - light day; Reg tampon every 4 hours; 22nd - spotting; 23rd - between light and med; 24th/25th - light spotting; stopped

## 2017-09-21 ENCOUNTER — Ambulatory Visit
Admission: RE | Admit: 2017-09-21 | Discharge: 2017-09-21 | Disposition: A | Discharge status: Home / Self Care | Payer: BLUE CROSS/BLUE SHIELD | Source: Ambulatory Visit | Attending: Obstetrics and Gynecology | Admitting: Obstetrics and Gynecology

## 2017-09-21 DIAGNOSIS — Z975 Presence of (intrauterine) contraceptive device: Secondary | ICD-10-CM | POA: Insufficient documentation

## 2017-09-21 DIAGNOSIS — N939 Abnormal uterine and vaginal bleeding, unspecified: Secondary | ICD-10-CM

## 2017-09-22 ENCOUNTER — Encounter: Payer: Self-pay | Admitting: Obstetrics and Gynecology

## 2017-09-22 ENCOUNTER — Ambulatory Visit (INDEPENDENT_AMBULATORY_CARE_PROVIDER_SITE_OTHER): Payer: BLUE CROSS/BLUE SHIELD | Admitting: Obstetrics and Gynecology

## 2017-09-22 VITALS — BP 142/90 | HR 72 | Wt 218.0 lb

## 2017-09-22 DIAGNOSIS — Z3009 Encounter for other general counseling and advice on contraception: Secondary | ICD-10-CM

## 2017-09-22 DIAGNOSIS — Z8742 Personal history of other diseases of the female genital tract: Secondary | ICD-10-CM

## 2017-09-22 NOTE — Progress Notes (Signed)
Obstetrics and Gynecology Visit Return Patient Evaluation  Appointment Date: 09/22/2017  Primary Care Provider: Default, Provider  OBGYN Clinic: Center for [facility]  Chief Complaint: follow up AUB and u/s  History of Present Illness:  Sydney Powell is a 47 y.o. Mirena and AUB. Last seen 8 days ago and has had no VB or pain since. U/s yesterday normal with ?IUD sitting low in the uterus.   Review of Systems: as noted in the History of Present Illness.   Patient Active Problem List   Diagnosis Date Noted  . Transient hypertension 09/14/2017  . EE (eosinophilic esophagitis) 50/56/9794  . Adaptive colitis 03/27/2014  . Diabetes mellitus, type 2 ([place]) 08/01/2013  . Borderline diabetes 04/13/2013  . Acid reflux 04/07/2013  . Crohn disease ([place]) 01/02/2013  . Diverticulitis large intestine 01/02/2013  . Malpositioned IUD 01/02/2013  . Diverticulitis of sigmoid colon 10/07/2012  . Allergic rhinitis 06/06/2012   Medications:  Suella Grove had no medications administered during this visit. Current Outpatient Medications  Medication Sig Dispense Refill  . acetaminophen (TYLENOL) 500 MG tablet Take 1 tablet by mouth as needed.    Marland Kitchen GLIPIZIDE XL 2.5 MG 24 hr tablet 10 mg.     . albuterol (PROVENTIL HFA;VENTOLIN HFA) 108 (90 BASE) MCG/ACT inhaler Inhale into the lungs.    Marland Kitchen azathioprine (IMURAN) 100 MG tablet Take by mouth.    . B Complex Vitamins (VITAMIN B COMPLEX PO) Take by mouth.    . Blood Glucose Monitoring Suppl (ONE TOUCH ULTRA MINI) W/DEVICE KIT     . Cetirizine HCl 10 MG CAPS Take by mouth.    . Cholecalciferol (VITAMIN D-3 PO) Take by mouth.    . doxycycline (MONODOX) 100 MG capsule Take 1 capsule by mouth daily.    Marland Kitchen losartan (COZAAR) 50 MG tablet Take 50 mg by mouth daily.    . ONE TOUCH ULTRA TEST test strip     . ONETOUCH DELICA LANCETS FINE MISC     . ranitidine (ZANTAC) 300 MG tablet Take by mouth.    . simvastatin (ZOCOR) 10 MG tablet Take 1  tablet by mouth daily.     No current facility-administered medications for this visit.     Allergies: is allergic to montelukast sodium; adhesive [tape]; and phenergan [promethazine hcl].  Physical Exam:  BP (!) 142/90   Pulse 72   Wt 218 lb (98.9 kg)   BMI 34.14 kg/m  Body mass index is 34.14 kg/m. General appearance: Well nourished, well developed female in no acute distress.  Neuro/Psych:  Normal mood and affect.     Radiology:  CLINICAL DATA:  Abnormal vaginal bleeding.  IUD.  EXAM: TRANSABDOMINAL AND TRANSVAGINAL ULTRASOUND OF PELVIS  TECHNIQUE: Both transabdominal and transvaginal ultrasound examinations of the pelvis were performed. Transabdominal technique was performed for global imaging of the pelvis including uterus, ovaries, adnexal regions, and pelvic cul-de-sac. It was necessary to proceed with endovaginal exam following the transabdominal exam to visualize the IUD and ovaries.  COMPARISON:  None  FINDINGS: Uterus  Measurements: 9.2 x 4.4 x 6.3 cm. No fibroids or other mass visualized.  Endometrium  Thickness: Approximately 8 mm, although partially obscured by acoustic shadowing from IUD. IUD is seen within the endometrial cavity, but appears low in position with tip in the mid to lower uterine corpus.  Right ovary  Measurements: 1.5 x 1.3 x 2.1 cm. Normal appearance/no adnexal mass.  Left ovary  Measurements: 1.7 x 2.2 x 1.6 cm. Normal appearance/no adnexal  mass.  Other findings  No abnormal free fluid.  IMPRESSION: IUD is visualized within the endometrial cavity, but appears low in position with tip in the mid to lower uterine corpus.  No pelvic mass or other significant abnormality identified.   Electronically Signed   By: Earle Gell M.D.   On: 09/21/2017 12:16  Assessment: pt doing well  Plan: I reviewed the images and they aren't great and it doesn't look conclusively like it's sitting low or is  malpositioned and the u/s is otherwise normal. I told her that given this and her s/s are now gone that I would just do exp management. She has the IUD in for period control. I told her that sometimes being on hormones chronically can cause the lining to thin out and cause AUB but I wouldn't recommend supplemental estrogen in her case.  If s/s recur, d/w her re: medical surgical options  RTC: PRN  Durene Romans MD Attending Center for [facility])

## 2017-10-26 DIAGNOSIS — G5701 Lesion of sciatic nerve, right lower limb: Secondary | ICD-10-CM | POA: Insufficient documentation

## 2018-01-03 ENCOUNTER — Encounter: Payer: Self-pay | Admitting: Radiology

## 2018-03-08 ENCOUNTER — Other Ambulatory Visit: Payer: Self-pay | Admitting: Student

## 2018-03-08 DIAGNOSIS — R109 Unspecified abdominal pain: Secondary | ICD-10-CM

## 2018-03-08 DIAGNOSIS — K5000 Crohn's disease of small intestine without complications: Secondary | ICD-10-CM

## 2018-03-08 DIAGNOSIS — R197 Diarrhea, unspecified: Secondary | ICD-10-CM

## 2018-03-09 ENCOUNTER — Other Ambulatory Visit
Admission: RE | Admit: 2018-03-09 | Discharge: 2018-03-09 | Disposition: A | Discharge status: Home / Self Care | Payer: 59 | Source: Ambulatory Visit | Attending: Student | Admitting: Student

## 2018-03-09 ENCOUNTER — Ambulatory Visit
Admission: RE | Admit: 2018-03-09 | Discharge: 2018-03-09 | Disposition: A | Discharge status: Home / Self Care | Payer: 59 | Source: Ambulatory Visit | Attending: Student | Admitting: Student

## 2018-03-09 DIAGNOSIS — K5000 Crohn's disease of small intestine without complications: Secondary | ICD-10-CM

## 2018-03-09 DIAGNOSIS — R197 Diarrhea, unspecified: Secondary | ICD-10-CM | POA: Insufficient documentation

## 2018-03-09 DIAGNOSIS — R109 Unspecified abdominal pain: Secondary | ICD-10-CM | POA: Diagnosis present

## 2018-03-09 HISTORY — DX: Essential (primary) hypertension: I10

## 2018-03-09 LAB — GASTROINTESTINAL PANEL BY PCR, STOOL (REPLACES STOOL CULTURE)

## 2018-03-09 LAB — C DIFFICILE QUICK SCREEN W PCR REFLEX
C DIFFICILE (CDIFF) INTERP: NOT DETECTED
C Diff antigen: NEGATIVE
C Diff toxin: NEGATIVE

## 2018-03-09 MED ORDER — IOPAMIDOL (ISOVUE-300) INJECTION 61%
100.0000 mL | Freq: Once | INTRAVENOUS | Status: AC | PRN
  Administered 2018-03-09: 100 mL via INTRAVENOUS

## 2018-03-10 LAB — CALPROTECTIN, FECAL: Calprotectin, Fecal: 23 ug/g (ref 0–120)

## 2018-04-14 ENCOUNTER — Other Ambulatory Visit: Payer: Self-pay

## 2018-04-14 ENCOUNTER — Encounter: Payer: Self-pay | Admitting: Family Medicine

## 2018-04-14 ENCOUNTER — Ambulatory Visit (INDEPENDENT_AMBULATORY_CARE_PROVIDER_SITE_OTHER): Payer: 59 | Admitting: Family Medicine

## 2018-04-14 VITALS — BP 152/89 | HR 72 | Wt 215.4 lb

## 2018-04-14 DIAGNOSIS — Z30431 Encounter for routine checking of intrauterine contraceptive device: Secondary | ICD-10-CM

## 2018-04-14 NOTE — Progress Notes (Signed)
   Subjective:    Patient ID: Sydney Powell is a 48 y.o. female presenting with Gynecologic Exam  on 04/14/2018  HPI: Here today because she felt her IUD strings were very long. She has since checked again and they were curled up behind her cervix. No pain or worsening bleeding. She has had u/s which showed IUD lower in uterus , and not in the cervix and she was concerned that things had gotten worse.  Review of Systems  Constitutional: Negative for chills and fever.  Respiratory: Negative for shortness of breath.   Cardiovascular: Negative for chest pain.  Gastrointestinal: Negative for abdominal pain, nausea and vomiting.  Genitourinary: Negative for dysuria.  Skin: Negative for rash.      Objective:    BP (!) 152/89   Pulse 72   Wt 215 lb 6.4 oz (97.7 kg)   BMI 33.74 kg/m  Physical Exam Constitutional:      General: She is not in acute distress.    Appearance: She is well-developed.  HENT:     Head: Normocephalic and atraumatic.  Eyes:     General: No scleral icterus. Neck:     Musculoskeletal: Neck supple.  Cardiovascular:     Rate and Rhythm: Normal rate.  Pulmonary:     Effort: Pulmonary effort is normal.  Abdominal:     Palpations: Abdomen is soft.  Genitourinary:    Comments: Cervix is posterior and strings are tucked up behind. No evidence of IUD is seen. Skin:    General: Skin is warm and dry.  Neurological:     Mental Status: She is alert and oriented to person, place, and time.         Assessment & Plan:  IUD check up - doing well--strings are up behind cervix, will leave them be.   Total face-to-face time with patient: 10 minutes. Over 50% of encounter was spent on counseling and coordination of care. Return in 1 year (on 04/14/2019).  Donnamae Jude 04/14/2018 2:34 PM

## 2018-04-14 NOTE — Progress Notes (Signed)
Last pap 05/04/2017- normal   Mammogram due in June  No STI testing

## 2018-06-16 DIAGNOSIS — L719 Rosacea, unspecified: Secondary | ICD-10-CM | POA: Insufficient documentation

## 2018-07-14 ENCOUNTER — Other Ambulatory Visit
Admission: RE | Admit: 2018-07-14 | Discharge: 2018-07-14 | Disposition: A | Discharge status: Home / Self Care | Payer: 59 | Source: Ambulatory Visit | Attending: Student | Admitting: Student

## 2018-07-14 DIAGNOSIS — K5000 Crohn's disease of small intestine without complications: Secondary | ICD-10-CM | POA: Insufficient documentation

## 2018-07-14 DIAGNOSIS — R197 Diarrhea, unspecified: Secondary | ICD-10-CM | POA: Insufficient documentation

## 2018-07-14 LAB — GASTROINTESTINAL PANEL BY PCR, STOOL (REPLACES STOOL CULTURE)

## 2018-07-14 LAB — C DIFFICILE QUICK SCREEN W PCR REFLEX
C Diff antigen: NEGATIVE
C Diff interpretation: NOT DETECTED
C Diff toxin: NEGATIVE

## 2018-07-19 LAB — CALPROTECTIN, FECAL: Calprotectin, Fecal: <16 ug/g (ref 0–120)

## 2018-07-21 DIAGNOSIS — N921 Excessive and frequent menstruation with irregular cycle: Secondary | ICD-10-CM | POA: Insufficient documentation

## 2018-08-11 ENCOUNTER — Ambulatory Visit: Admit: 2018-08-11 | Payer: 59 | Admitting: Internal Medicine

## 2018-08-11 SURGERY — ESOPHAGOGASTRODUODENOSCOPY (EGD) WITH PROPOFOL
Anesthesia: General

## 2018-08-31 ENCOUNTER — Ambulatory Visit (INDEPENDENT_AMBULATORY_CARE_PROVIDER_SITE_OTHER): Payer: 59 | Admitting: Family Medicine

## 2018-08-31 ENCOUNTER — Other Ambulatory Visit: Payer: Self-pay

## 2018-08-31 ENCOUNTER — Encounter: Payer: Self-pay | Admitting: Family Medicine

## 2018-08-31 DIAGNOSIS — N921 Excessive and frequent menstruation with irregular cycle: Secondary | ICD-10-CM

## 2018-08-31 DIAGNOSIS — Z124 Encounter for screening for malignant neoplasm of cervix: Secondary | ICD-10-CM

## 2018-08-31 DIAGNOSIS — Z01419 Encounter for gynecological examination (general) (routine) without abnormal findings: Secondary | ICD-10-CM

## 2018-08-31 DIAGNOSIS — Z1151 Encounter for screening for human papillomavirus (HPV): Secondary | ICD-10-CM

## 2018-08-31 NOTE — Patient Instructions (Addendum)
Preventive Care 40-47 Years Old, Female Preventive care refers to visits with your health care provider and lifestyle choices that can promote health and wellness. This includes:  A yearly physical exam. This may also be called an annual well check.  Regular dental visits and eye exams.  Immunizations.  Screening for certain conditions.  Healthy lifestyle choices, such as eating a healthy diet, getting regular exercise, not using drugs or products that contain nicotine and tobacco, and limiting alcohol use. What can I expect for my preventive care visit? Physical exam Your health care provider will check your:  Height and weight. This may be used to calculate body mass index (BMI), which tells if you are at a healthy weight.  Heart rate and blood pressure.  Skin for abnormal spots. Counseling Your health care provider may ask you questions about your:  Alcohol, tobacco, and drug use.  Emotional well-being.  Home and relationship well-being.  Sexual activity.  Eating habits.  Work and work environment.  Method of birth control.  Menstrual cycle.  Pregnancy history. What immunizations do I need?  Influenza (flu) vaccine  This is recommended every year. Tetanus, diphtheria, and pertussis (Tdap) vaccine  You may need a Td booster every 10 years. Varicella (chickenpox) vaccine  You may need this if you have not been vaccinated. Zoster (shingles) vaccine  You may need this after age 60. Measles, mumps, and rubella (MMR) vaccine  You may need at least one dose of MMR if you were born in 1957 or later. You may also need a second dose. Pneumococcal conjugate (PCV13) vaccine  You may need this if you have certain conditions and were not previously vaccinated. Pneumococcal polysaccharide (PPSV23) vaccine  You may need one or two doses if you smoke cigarettes or if you have certain conditions. Meningococcal conjugate (MenACWY) vaccine  You may need this if you  have certain conditions. Hepatitis A vaccine  You may need this if you have certain conditions or if you travel or work in places where you may be exposed to hepatitis A. Hepatitis B vaccine  You may need this if you have certain conditions or if you travel or work in places where you may be exposed to hepatitis B. Haemophilus influenzae type b (Hib) vaccine  You may need this if you have certain conditions. Human papillomavirus (HPV) vaccine  If recommended by your health care provider, you may need three doses over 6 months. You may receive vaccines as individual doses or as more than one vaccine together in one shot (combination vaccines). Talk with your health care provider about the risks and benefits of combination vaccines. What tests do I need? Blood tests  Lipid and cholesterol levels. These may be checked every 5 years, or more frequently if you are over 50 years old.  Hepatitis C test.  Hepatitis B test. Screening  Lung cancer screening. You may have this screening every year starting at age 55 if you have a 30-pack-year history of smoking and currently smoke or have quit within the past 15 years.  Colorectal cancer screening. All adults should have this screening starting at age 50 and continuing until age 75. Your health care provider may recommend screening at age 45 if you are at increased risk. You will have tests every 1-10 years, depending on your results and the type of screening test.  Diabetes screening. This is done by checking your blood sugar (glucose) after you have not eaten for a while (fasting). You may have this   done every 1-3 years.  Mammogram. This may be done every 1-2 years. Talk with your health care provider about when you should start having regular mammograms. This may depend on whether you have a family history of breast cancer.  BRCA-related cancer screening. This may be done if you have a family history of breast, ovarian, tubal, or peritoneal  cancers.  Pelvic exam and Pap test. This may be done every 3 years starting at age 44. Starting at age 58, this may be done every 5 years if you have a Pap test in combination with an HPV test. Other tests  Sexually transmitted disease (STD) testing.  Bone density scan. This is done to screen for osteoporosis. You may have this scan if you are at high risk for osteoporosis. Follow these instructions at home: Eating and drinking  Eat a diet that includes fresh fruits and vegetables, whole grains, lean protein, and low-fat dairy.  Take vitamin and mineral supplements as recommended by your health care provider.  Do not drink alcohol if: ? Your health care provider tells you not to drink. ? You are pregnant, may be pregnant, or are planning to become pregnant.  If you drink alcohol: ? Limit how much you have to 0-1 drink a day. ? Be aware of how much alcohol is in your drink. In the [place] one drink equals one 12 oz bottle of beer (355 mL), one 5 oz glass of wine (148 mL), or one 1 oz glass of hard liquor (44 mL). Lifestyle  Take daily care of your teeth and gums.  Stay active. Exercise for at least 30 minutes on 5 or more days each week.  Do not use any products that contain nicotine or tobacco, such as cigarettes, e-cigarettes, and chewing tobacco. If you need help quitting, ask your health care provider.  If you are sexually active, practice safe sex. Use a condom or other form of birth control (contraception) in order to prevent pregnancy and STIs (sexually transmitted infections).  If told by your health care provider, take low-dose aspirin daily starting at age 70. What's next?  Visit your health care provider once a year for a well check visit.  Ask your health care provider how often you should have your eyes and teeth checked.  Stay up to date on all vaccines. This information is not intended to replace advice given to you by your health care provider. Make sure you  discuss any questions you have with your health care provider. Document Released: 02/01/2015 Document Revised: 09/16/2017 Document Reviewed: 09/16/2017 Elsevier Patient Education  2020 [employer].  Endometrial Ablation Endometrial ablation is a procedure that destroys the thin inner layer of the lining of the uterus (endometrium). This procedure may be done:  To stop heavy periods.  To stop bleeding that is causing anemia.  To control irregular bleeding.  To treat bleeding caused by small tumors (fibroids) in the endometrium. This procedure is often an alternative to major surgery, such as removal of the uterus and cervix (hysterectomy). As a result of this procedure:  You may not be able to have children. However, if you are premenopausal (you have not gone through menopause): ? You may still have a small chance of getting pregnant. ? You will need to use a reliable method of birth control after the procedure to prevent pregnancy.  You may stop having a menstrual period, or you may have only a small amount of bleeding during your period. Menstruation may return several years  after the procedure. Tell a health care provider about:  Any allergies you have.  All medicines you are taking, including vitamins, herbs, eye drops, creams, and over-the-counter medicines.  Any problems you or family members have had with the use of anesthetic medicines.  Any blood disorders you have.  Any surgeries you have had.  Any medical conditions you have. What are the risks? Generally, this is a safe procedure. However, problems may occur, including:  A hole (perforation) in the uterus or bowel.  Infection of the uterus, bladder, or vagina.  Bleeding.  Damage to other structures or organs.  An air bubble in the lung (air embolus).  Problems with pregnancy after the procedure.  Failure of the procedure.  Decreased ability to diagnose cancer in the endometrium. What happens before  the procedure?  You will have tests of your endometrium to make sure there are no pre-cancerous cells or cancer cells present.  You may have an ultrasound of the uterus.  You may be given medicines to thin the endometrium.  Ask your health care provider about: ? Changing or stopping your regular medicines. This is especially important if you take diabetes medicines or blood thinners. ? Taking medicines such as aspirin and ibuprofen. These medicines can thin your blood. Do not take these medicines before your procedure if your doctor tells you not to.  Plan to have someone take you home from the hospital or clinic. What happens during the procedure?   You will lie on an exam table with your feet and legs supported as in a pelvic exam.  To lower your risk of infection: ? Your health care team will wash or sanitize their hands and put on germ-free (sterile) gloves. ? Your genital area will be washed with soap.  An IV tube will be inserted into one of your veins.  You will be given a medicine to help you relax (sedative).  A surgical instrument with a light and camera (resectoscope) will be inserted into your vagina and moved into your uterus. This allows your surgeon to see inside your uterus.  Endometrial tissue will be removed using one of the following methods: ? Radiofrequency. This method uses a radiofrequency-alternating electric current to remove the endometrium. ? Cryotherapy. This method uses extreme cold to freeze the endometrium. ? Heated-free liquid. This method uses a heated saltwater (saline) solution to remove the endometrium. ? Microwave. This method uses high-energy microwaves to heat up the endometrium and remove it. ? Thermal balloon. This method involves inserting a catheter with a balloon tip into the uterus. The balloon tip is filled with heated fluid to remove the endometrium. The procedure may vary among health care providers and hospitals. What happens after  the procedure?  Your blood pressure, heart rate, breathing rate, and blood oxygen level will be monitored until the medicines you were given have worn off.  As tissue healing occurs, you may notice vaginal bleeding for 4-6 weeks after the procedure. You may also experience: ? Cramps. ? Thin, watery vaginal discharge that is light pink or brown in color. ? A need to urinate more frequently than usual. ? Nausea.  Do not drive for 24 hours if you were given a sedative.  Do not have sex or insert anything into your vagina until your health care provider approves. Summary  Endometrial ablation is done to treat the many causes of heavy menstrual bleeding.  The procedure may be done only after medications have been tried to control the bleeding.  Plan to have someone take you home from the hospital or clinic. This information is not intended to replace advice given to you by your health care provider. Make sure you discuss any questions you have with your health care provider. Document Released: 11/15/2003 Document Revised: 06/22/2017 Document Reviewed: 01/23/2016 Elsevier Patient Education  2020 [employer].

## 2018-08-31 NOTE — Progress Notes (Signed)
  Subjective:     Sydney Powell is a 48 y.o. female and is here for a comprehensive physical exam. The patient reports problems - still bleeding. Has IUD, but spots or bleeds more days than not. Cycles are heavy and last > 5 days. She has been anemic lately..  The following portions of the patient's history were reviewed and updated as appropriate: allergies, current medications, past family history, past medical history, past social history, past surgical history and problem list.  Review of Systems Pertinent items noted in HPI and remainder of comprehensive ROS otherwise negative.   Objective:    BP (!) 141/89   Pulse 72   Wt 209 lb 8 oz (95 kg)   LMP 08/11/2018   BMI 32.81 kg/m  General appearance: alert, cooperative and appears stated age Head: Normocephalic, without obvious abnormality, atraumatic Neck: no adenopathy, supple, symmetrical, trachea midline and thyroid not enlarged, symmetric, no tenderness/mass/nodules Lungs: clear to auscultation bilaterally Breasts: normal appearance, no masses or tenderness Heart: regular rate and rhythm, S1, S2 normal, no murmur, click, rub or gallop Abdomen: soft, non-tender; bowel sounds normal; no masses,  no organomegaly Pelvic: cervix normal in appearance, external genitalia normal, no adnexal masses or tenderness, no cervical motion tenderness, uterus normal size, shape, and consistency, vagina normal without discharge and IUD strings present Extremities: extremities normal, atraumatic, no cyanosis or edema Pulses: 2+ and symmetric Skin: Skin color, texture, turgor normal. No rashes or lesions Lymph nodes: Cervical, supraclavicular, and axillary nodes normal. Neurologic: Grossly normal    Assessment:    Healthy female exam.      Plan:      Problem List Items Addressed This Visit      Unprioritized   Menorrhagia with irregular cycle    After lengthy discussion, she opts for endometrial ablation. We will not sterilize her due  to risks following prior abdominal surgery, GI issues. She knows not to risk pregnancy after ablation. Will remove IUD and sample her at same time.Risks include but are not limited to bleeding, infection, injury to surrounding structures, including bowel, bladder and ureters, blood clots, and death.  Likelihood of success is high.        Other Visit Diagnoses    Screening for malignant neoplasm of cervix       Relevant Orders   Cytology - PAP( [place])   Encounter for gynecological examination without abnormal finding         Return in 1 year (on 08/31/2019).  See After Visit Summary for Counseling Recommendations

## 2018-08-31 NOTE — Progress Notes (Signed)
LAST PAP 03/11/2017- Normal  STI: NO Mammogram:ANN 2020-Normal

## 2018-08-31 NOTE — Assessment & Plan Note (Signed)
After lengthy discussion, she opts for endometrial ablation. We will not sterilize her due to risks following prior abdominal surgery, GI issues. She knows not to risk pregnancy after ablation. Will remove IUD and sample her at same time.Risks include but are not limited to bleeding, infection, injury to surrounding structures, including bowel, bladder and ureters, blood clots, and death.  Likelihood of success is high.

## 2018-09-02 LAB — CYTOLOGY - PAP
Diagnosis: NEGATIVE
HPV: NOT DETECTED

## 2018-09-06 ENCOUNTER — Other Ambulatory Visit: Payer: Self-pay | Admitting: Student

## 2018-09-06 DIAGNOSIS — K5000 Crohn's disease of small intestine without complications: Secondary | ICD-10-CM

## 2018-09-06 DIAGNOSIS — R1084 Generalized abdominal pain: Secondary | ICD-10-CM

## 2018-09-06 DIAGNOSIS — R197 Diarrhea, unspecified: Secondary | ICD-10-CM

## 2018-10-07 ENCOUNTER — Ambulatory Visit: Admission: RE | Admit: 2018-10-07 | Payer: 59 | Source: Ambulatory Visit

## 2018-10-07 ENCOUNTER — Other Ambulatory Visit: Payer: Self-pay

## 2018-10-07 ENCOUNTER — Ambulatory Visit
Admission: RE | Admit: 2018-10-07 | Discharge: 2018-10-07 | Disposition: A | Discharge status: Home / Self Care | Payer: 59 | Source: Ambulatory Visit | Attending: Student | Admitting: Student

## 2018-10-07 DIAGNOSIS — R1084 Generalized abdominal pain: Secondary | ICD-10-CM | POA: Insufficient documentation

## 2018-10-07 DIAGNOSIS — K5000 Crohn's disease of small intestine without complications: Secondary | ICD-10-CM | POA: Insufficient documentation

## 2018-10-07 DIAGNOSIS — R197 Diarrhea, unspecified: Secondary | ICD-10-CM

## 2018-10-07 HISTORY — DX: Unspecified asthma, uncomplicated: J45.909

## 2018-10-07 LAB — POCT I-STAT CREATININE: Creatinine, Ser: 0.6 mg/dL (ref 0.44–1.00)

## 2018-10-07 MED ORDER — IOHEXOL 300 MG/ML  SOLN
100.0000 mL | Freq: Once | INTRAMUSCULAR | Status: AC | PRN
  Administered 2018-10-07: 100 mL via INTRAVENOUS

## 2018-11-09 ENCOUNTER — Other Ambulatory Visit: Payer: Self-pay

## 2018-11-09 ENCOUNTER — Encounter (HOSPITAL_BASED_OUTPATIENT_CLINIC_OR_DEPARTMENT_OTHER): Payer: Self-pay | Admitting: *Deleted

## 2018-11-10 NOTE — Progress Notes (Signed)
Notified pt of lab work needed prior to surgery, pt verbalized understanding.

## 2018-11-11 NOTE — H&P (Addendum)
  Sydney Powell is an 49 y.o. G32P1011 female.   Chief Complaint: Abnormal bleeding HPI: Still bleeding. Has IUD in since 2011, changed in 2016 and spotting since we changed it. She reports that she spots or bleeds more days than not. Cycles are heavy and last > 5 days. She has been anemic lately  Past Medical History:  Diagnosis Date  . Acid reflux 04/07/2013  . Asthma   . Crohn's disease ([place])   . Diabetes mellitus without complication (HCC)    Type 2  . Diverticulitis   . Hypertension   . PONV (postoperative nausea and vomiting)     Past Surgical History:  Procedure Laterality Date  . APPENDECTOMY    . CHOLECYSTECTOMY    . TONSILLECTOMY      Family History  Problem Relation Age of Onset  . Hypertension Mother   . Diabetes Mother   . Hypothyroidism Mother   . Obesity Mother   . Diabetes Maternal Grandmother   . Cancer Maternal Grandmother        breast  . Stroke Maternal Grandmother   . Cancer Maternal Grandfather        leukemia  . Atrial fibrillation Paternal Grandmother    Social History:  reports that she quit smoking about 19 years ago. She has never used smokeless tobacco. She reports current alcohol use. She reports that she does not use drugs.  Allergies:  Allergies  Allergen Reactions  . Montelukast Sodium     Other reaction(s): Other (See Comments) Excessive drowsiness, light headedness " feels like a space cadet"  . Adhesive [Tape]   . Phenergan [Promethazine Hcl] Other (See Comments)    hallucinations    No medications prior to admission.    Pertinent items are noted in HPI.  Height 5' 7"  (1.702 m), weight 92.5 kg, last menstrual period 10/13/2018. Ht 5' 7"  (1.702 m)   Wt 92.5 kg   LMP 10/13/2018   BMI 31.95 kg/m  General appearance: alert, cooperative and appears stated age Head: Normocephalic, without obvious abnormality, atraumatic Neck: supple, symmetrical, trachea midline Lungs: normal effort Heart: regular rate and rhythm Abdomen:  soft, non-tender; bowel sounds normal; no masses,  no organomegaly Extremities: Homans sign is negative, no sign of DVT Skin: Skin color, texture, turgor normal. No rashes or lesions Neurologic: Grossly normal   No results found for: WBC, HGB, HCT, MCV, PLT Lab Results  Component Value Date   PREGTESTUR Negative 09/14/2017     Assessment/Plan Principal Problem:   Menorrhagia with irregular cycle  For endometrial ablation and IUD removal. Has not had BTL, and she knows not to get pregnant. Risks include but are not limited to bleeding, infection, injury to surrounding structures, including bowel, bladder and ureters, blood clots, and death.  Likelihood of success is high.    Donnamae Jude 11/11/2018, 10:48 AM

## 2018-11-12 ENCOUNTER — Other Ambulatory Visit (HOSPITAL_COMMUNITY): Payer: 59

## 2018-11-14 ENCOUNTER — Other Ambulatory Visit (HOSPITAL_COMMUNITY)
Admission: RE | Admit: 2018-11-14 | Discharge: 2018-11-14 | Disposition: A | Discharge status: Home / Self Care | Payer: 59 | Source: Ambulatory Visit | Attending: Family Medicine | Admitting: Family Medicine

## 2018-11-14 ENCOUNTER — Other Ambulatory Visit: Payer: Self-pay

## 2018-11-14 ENCOUNTER — Encounter (HOSPITAL_BASED_OUTPATIENT_CLINIC_OR_DEPARTMENT_OTHER)
Admission: RE | Admit: 2018-11-14 | Discharge: 2018-11-14 | Disposition: A | Discharge status: Home / Self Care | Payer: 59 | Source: Ambulatory Visit | Attending: Family Medicine | Admitting: Family Medicine

## 2018-11-14 DIAGNOSIS — Z20828 Contact with and (suspected) exposure to other viral communicable diseases: Secondary | ICD-10-CM | POA: Diagnosis not present

## 2018-11-14 DIAGNOSIS — Z01818 Encounter for other preprocedural examination: Secondary | ICD-10-CM | POA: Insufficient documentation

## 2018-11-14 DIAGNOSIS — R9431 Abnormal electrocardiogram [ECG] [EKG]: Secondary | ICD-10-CM | POA: Insufficient documentation

## 2018-11-14 DIAGNOSIS — Z01812 Encounter for preprocedural laboratory examination: Secondary | ICD-10-CM | POA: Insufficient documentation

## 2018-11-14 LAB — BASIC METABOLIC PANEL
Anion gap: 11 (ref 5–15)
BUN: 9 mg/dL (ref 6–20)
CO2: 23 mmol/L (ref 22–32)
Calcium: 9.1 mg/dL (ref 8.9–10.3)
Chloride: 103 mmol/L (ref 98–111)
Creatinine, Ser: 0.75 mg/dL (ref 0.44–1.00)
GFR calc Af Amer: >60 mL/min (ref >60)
GFR calc non Af Amer: >60 mL/min (ref >60)
Glucose, Bld: 188 mg/dL — ABNORMAL HIGH (ref 70–99)
Potassium: 3.9 mmol/L (ref 3.5–5.1)
Sodium: 137 mmol/L (ref 135–145)

## 2018-11-14 LAB — CBC
HCT: 37.4 % (ref 36.0–46.0)
Hemoglobin: 11.8 g/dL — ABNORMAL LOW (ref 12.0–15.0)
MCH: 29.2 pg (ref 26.0–34.0)
MCHC: 31.6 g/dL (ref 30.0–36.0)
MCV: 92.6 fL (ref 80.0–100.0)
Platelets: 428 10*3/uL — ABNORMAL HIGH (ref 150–400)
RBC: 4.04 MIL/uL (ref 3.87–5.11)
RDW: 14.3 % (ref 11.5–15.5)
WBC: 9 10*3/uL (ref 4.0–10.5)
nRBC: 0 % (ref 0.0–0.2)

## 2018-11-14 LAB — SARS CORONAVIRUS 2 (TAT 6-24 HRS): SARS Coronavirus 2: NEGATIVE

## 2018-11-14 LAB — POCT PREGNANCY, URINE: Preg Test, Ur: NEGATIVE

## 2018-11-14 NOTE — Progress Notes (Signed)
      Enhanced Recovery after Surgery  Enhanced Recovery after Surgery is a protocol used to improve the stress on your body and your recovery after surgery.  Patient Instructions  . The night before surgery:  o No food after midnight. ONLY clear liquids after midnight  . The day of surgery (if you do NOT have diabetes):  o Drink ONE (1) Pre-Surgery Clear Ensure as directed.   o This drink was given to you during your hospital  pre-op appointment visit. o The pre-op nurse will instruct you on the time to drink the  Pre-Surgery Ensure depending on your surgery time. o Finish the drink at the designated time by the pre-op nurse.  o Nothing else to drink after completing the  Pre-Surgery Clear Ensure.  . The day of surgery (if you have diabetes): o Drink ONE (1) Gatorade 2 (G2) as directed. o This drink was given to you during your hospital  pre-op appointment visit.  o The pre-op nurse will instruct you on the time to drink the   Gatorade 2 (G2) depending on your surgery time. o Color of the Gatorade may vary. Red is not allowed. o Nothing else to drink after completing the  Gatorade 2 (G2).         If you have questions, please contact your surgeon's office. 

## 2018-11-16 ENCOUNTER — Other Ambulatory Visit: Payer: Self-pay

## 2018-11-16 ENCOUNTER — Encounter (HOSPITAL_BASED_OUTPATIENT_CLINIC_OR_DEPARTMENT_OTHER)
Admission: RE | Disposition: A | Discharge status: Home / Self Care | Payer: Self-pay | Source: Home / Self Care | Attending: Family Medicine

## 2018-11-16 ENCOUNTER — Ambulatory Visit (HOSPITAL_BASED_OUTPATIENT_CLINIC_OR_DEPARTMENT_OTHER)
Admission: RE | Admit: 2018-11-16 | Discharge: 2018-11-16 | Disposition: A | Discharge status: Home / Self Care | Payer: 59 | Attending: Family Medicine | Admitting: Family Medicine

## 2018-11-16 ENCOUNTER — Ambulatory Visit (HOSPITAL_BASED_OUTPATIENT_CLINIC_OR_DEPARTMENT_OTHER): Payer: 59 | Admitting: Anesthesiology

## 2018-11-16 ENCOUNTER — Encounter (HOSPITAL_BASED_OUTPATIENT_CLINIC_OR_DEPARTMENT_OTHER): Payer: Self-pay

## 2018-11-16 ENCOUNTER — Telehealth: Payer: Self-pay | Admitting: Radiology

## 2018-11-16 DIAGNOSIS — J45909 Unspecified asthma, uncomplicated: Secondary | ICD-10-CM | POA: Diagnosis not present

## 2018-11-16 DIAGNOSIS — Z30432 Encounter for removal of intrauterine contraceptive device: Secondary | ICD-10-CM | POA: Diagnosis not present

## 2018-11-16 DIAGNOSIS — K5090 Crohn's disease, unspecified, without complications: Secondary | ICD-10-CM | POA: Insufficient documentation

## 2018-11-16 DIAGNOSIS — E119 Type 2 diabetes mellitus without complications: Secondary | ICD-10-CM | POA: Insufficient documentation

## 2018-11-16 DIAGNOSIS — Z87891 Personal history of nicotine dependence: Secondary | ICD-10-CM | POA: Diagnosis not present

## 2018-11-16 DIAGNOSIS — D250 Submucous leiomyoma of uterus: Secondary | ICD-10-CM | POA: Diagnosis not present

## 2018-11-16 DIAGNOSIS — N840 Polyp of corpus uteri: Secondary | ICD-10-CM | POA: Insufficient documentation

## 2018-11-16 DIAGNOSIS — D649 Anemia, unspecified: Secondary | ICD-10-CM | POA: Diagnosis not present

## 2018-11-16 DIAGNOSIS — Z888 Allergy status to other drugs, medicaments and biological substances status: Secondary | ICD-10-CM | POA: Diagnosis not present

## 2018-11-16 DIAGNOSIS — I10 Essential (primary) hypertension: Secondary | ICD-10-CM | POA: Insufficient documentation

## 2018-11-16 DIAGNOSIS — N921 Excessive and frequent menstruation with irregular cycle: Secondary | ICD-10-CM

## 2018-11-16 DIAGNOSIS — Z8249 Family history of ischemic heart disease and other diseases of the circulatory system: Secondary | ICD-10-CM | POA: Diagnosis not present

## 2018-11-16 HISTORY — DX: Other specified postprocedural states: Z98.890

## 2018-11-16 HISTORY — DX: Nausea with vomiting, unspecified: R11.2

## 2018-11-16 HISTORY — PX: DILITATION & CURRETTAGE/HYSTROSCOPY WITH HYDROTHERMAL ABLATION: SHX5570

## 2018-11-16 LAB — GLUCOSE, CAPILLARY
Glucose-Capillary: 163 mg/dL — ABNORMAL HIGH (ref 70–99)
Glucose-Capillary: 115 mg/dL — ABNORMAL HIGH (ref 70–99)

## 2018-11-16 SURGERY — DILATATION & CURETTAGE/HYSTEROSCOPY WITH HYDROTHERMAL ABLATION
Anesthesia: General

## 2018-11-16 MED ORDER — PROPOFOL 500 MG/50ML IV EMUL
INTRAVENOUS | Status: DC | PRN
  Administered 2018-11-16: 175 ug/kg/min via INTRAVENOUS

## 2018-11-16 MED ORDER — DEXAMETHASONE SODIUM PHOSPHATE 10 MG/ML IJ SOLN
INTRAMUSCULAR | Status: DC | PRN
  Administered 2018-11-16: 10 mg via INTRAVENOUS

## 2018-11-16 MED ORDER — CELECOXIB 400 MG PO CAPS
400.0000 mg | ORAL_CAPSULE | ORAL | Status: AC
  Administered 2018-11-16: 400 mg via ORAL

## 2018-11-16 MED ORDER — KETOROLAC TROMETHAMINE 30 MG/ML IJ SOLN
INTRAMUSCULAR | Status: AC
  Filled 2018-11-16: qty 1

## 2018-11-16 MED ORDER — OXYCODONE HCL 5 MG PO TABS: 5.0000 mg | ORAL_TABLET | Freq: Once | ORAL | Status: DC | PRN

## 2018-11-16 MED ORDER — SCOPOLAMINE 1 MG/3DAYS TD PT72
MEDICATED_PATCH | TRANSDERMAL | Status: AC
  Filled 2018-11-16: qty 1

## 2018-11-16 MED ORDER — OXYCODONE HCL 5 MG PO TABS: 5.0000 mg | ORAL_TABLET | Freq: Four times a day (QID) | ORAL | 0 refills | Status: AC | PRN

## 2018-11-16 MED ORDER — LIDOCAINE HCL (PF) 1 % IJ SOLN
INTRAMUSCULAR | Status: AC
  Filled 2018-11-16: qty 30

## 2018-11-16 MED ORDER — ONDANSETRON HCL 4 MG/2ML IJ SOLN
INTRAMUSCULAR | Status: AC
  Filled 2018-11-16: qty 2

## 2018-11-16 MED ORDER — LIDOCAINE 2% (20 MG/ML) 5 ML SYRINGE
INTRAMUSCULAR | Status: AC
  Filled 2018-11-16: qty 5

## 2018-11-16 MED ORDER — FENTANYL CITRATE (PF) 100 MCG/2ML IJ SOLN
INTRAMUSCULAR | Status: AC
  Filled 2018-11-16: qty 2

## 2018-11-16 MED ORDER — LACTATED RINGERS IV SOLN
INTRAVENOUS | Status: DC
  Administered 2018-11-16: 08:00:00 via INTRAVENOUS

## 2018-11-16 MED ORDER — FENTANYL CITRATE (PF) 100 MCG/2ML IJ SOLN
INTRAMUSCULAR | Status: DC | PRN
  Administered 2018-11-16 (×2): 50 ug via INTRAVENOUS

## 2018-11-16 MED ORDER — HYDROMORPHONE HCL 1 MG/ML IJ SOLN
0.2500 mg | INTRAMUSCULAR | Status: DC | PRN
  Administered 2018-11-16: 0.5 mg via INTRAVENOUS

## 2018-11-16 MED ORDER — CELECOXIB 200 MG PO CAPS
ORAL_CAPSULE | ORAL | Status: AC
  Filled 2018-11-16: qty 2

## 2018-11-16 MED ORDER — KETOROLAC TROMETHAMINE 30 MG/ML IJ SOLN
INTRAMUSCULAR | Status: DC | PRN
  Administered 2018-11-16: 30 mg via INTRAVENOUS

## 2018-11-16 MED ORDER — ONDANSETRON HCL 4 MG/2ML IJ SOLN: 4.0000 mg | Freq: Once | INTRAMUSCULAR | Status: DC | PRN

## 2018-11-16 MED ORDER — SCOPOLAMINE 1 MG/3DAYS TD PT72
1.0000 | MEDICATED_PATCH | TRANSDERMAL | Status: DC
  Administered 2018-11-16: 1.5 mg via TRANSDERMAL

## 2018-11-16 MED ORDER — LIDOCAINE HCL 1 % IJ SOLN
INTRAMUSCULAR | Status: DC | PRN
  Administered 2018-11-16: 20 mL

## 2018-11-16 MED ORDER — GABAPENTIN 300 MG PO CAPS
300.0000 mg | ORAL_CAPSULE | ORAL | Status: AC
  Administered 2018-11-16: 300 mg via ORAL

## 2018-11-16 MED ORDER — PROPOFOL 500 MG/50ML IV EMUL
INTRAVENOUS | Status: AC
  Filled 2018-11-16: qty 100

## 2018-11-16 MED ORDER — MIDAZOLAM HCL 2 MG/2ML IJ SOLN
INTRAMUSCULAR | Status: DC | PRN
  Administered 2018-11-16: 2 mg via INTRAVENOUS

## 2018-11-16 MED ORDER — OXYCODONE HCL 5 MG/5ML PO SOLN: 5.0000 mg | Freq: Once | ORAL | Status: DC | PRN

## 2018-11-16 MED ORDER — PROPOFOL 10 MG/ML IV BOLUS
INTRAVENOUS | Status: AC
  Filled 2018-11-16: qty 20

## 2018-11-16 MED ORDER — SODIUM CHLORIDE 0.9 % IR SOLN
Status: DC | PRN
  Administered 2018-11-16: 3000 mL

## 2018-11-16 MED ORDER — ACETAMINOPHEN 500 MG PO TABS
1000.0000 mg | ORAL_TABLET | ORAL | Status: AC
  Administered 2018-11-16: 1000 mg via ORAL

## 2018-11-16 MED ORDER — HYDROMORPHONE HCL 1 MG/ML IJ SOLN
INTRAMUSCULAR | Status: AC
  Filled 2018-11-16: qty 0.5

## 2018-11-16 MED ORDER — GABAPENTIN 300 MG PO CAPS
ORAL_CAPSULE | ORAL | Status: AC
  Filled 2018-11-16: qty 1

## 2018-11-16 MED ORDER — DEXAMETHASONE SODIUM PHOSPHATE 10 MG/ML IJ SOLN
INTRAMUSCULAR | Status: AC
  Filled 2018-11-16: qty 1

## 2018-11-16 MED ORDER — MIDAZOLAM HCL 2 MG/2ML IJ SOLN
INTRAMUSCULAR | Status: AC
  Filled 2018-11-16: qty 2

## 2018-11-16 MED ORDER — PROPOFOL 10 MG/ML IV BOLUS
INTRAVENOUS | Status: DC | PRN
  Administered 2018-11-16: 200 mg via INTRAVENOUS

## 2018-11-16 MED ORDER — ONDANSETRON HCL 4 MG/2ML IJ SOLN
INTRAMUSCULAR | Status: DC | PRN
  Administered 2018-11-16 (×2): 4 mg via INTRAVENOUS

## 2018-11-16 MED ORDER — MEPERIDINE HCL 25 MG/ML IJ SOLN: 6.2500 mg | INTRAMUSCULAR | Status: DC | PRN

## 2018-11-16 MED ORDER — LIDOCAINE 2% (20 MG/ML) 5 ML SYRINGE
INTRAMUSCULAR | Status: DC | PRN
  Administered 2018-11-16: 60 mg via INTRAVENOUS

## 2018-11-16 MED ORDER — ACETAMINOPHEN 500 MG PO TABS
ORAL_TABLET | ORAL | Status: AC
  Filled 2018-11-16: qty 2

## 2018-11-16 SURGICAL SUPPLY — 16 items
BRIEF STRETCH FOR OB PAD XXL (UNDERPADS AND DIAPERS) ×3 IMPLANT
CANISTER SUCT 3000ML PPV (MISCELLANEOUS) ×3 IMPLANT
CATH ROBINSON RED A/P 16FR (CATHETERS) ×3 IMPLANT
CLOTH BEACON ORANGE TIMEOUT ST (SAFETY) ×3 IMPLANT
CONTAINER PREFILL 10% NBF 60ML (FORM) ×6 IMPLANT
GAUZE 4X4 16PLY RFD (DISPOSABLE) ×3 IMPLANT
GLOVE BIOGEL PI IND STRL 7.0 (GLOVE) ×2 IMPLANT
GLOVE BIOGEL PI INDICATOR 7.0 (GLOVE) ×4
GLOVE ECLIPSE 7.0 STRL STRAW (GLOVE) ×3 IMPLANT
GOWN STRL REUS W/TWL LRG LVL3 (GOWN DISPOSABLE) ×6 IMPLANT
PACK VAGINAL MINOR WOMEN LF (CUSTOM PROCEDURE TRAY) ×3 IMPLANT
PAD OB MATERNITY 4.3X12.25 (PERSONAL CARE ITEMS) ×3 IMPLANT
PAD PREP 24X48 CUFFED NSTRL (MISCELLANEOUS) ×3 IMPLANT
SET GENESYS HTA PROCERVA (MISCELLANEOUS) ×3 IMPLANT
SLEEVE SCD COMPRESS KNEE MED (MISCELLANEOUS) ×3 IMPLANT
TOWEL GREEN STERILE FF (TOWEL DISPOSABLE) ×6 IMPLANT

## 2018-11-16 NOTE — Anesthesia Preprocedure Evaluation (Signed)
Anesthesia Evaluation  Patient identified by MRN, date of birth, ID band Patient awake    Reviewed: Allergy & Precautions, NPO status , Patient's Chart, lab work & pertinent test results  History of Anesthesia Complications (+) PONV and history of anesthetic complications  Airway Mallampati: II  TM Distance: >3 FB Neck ROM: Full    Dental  (+) Dental Advisory Given   Pulmonary asthma , former smoker,    Pulmonary exam normal breath sounds clear to auscultation       Cardiovascular hypertension, Pt. on medications Normal cardiovascular exam Rhythm:Regular Rate:Normal     Neuro/Psych negative neurological ROS  negative psych ROS   GI/Hepatic Neg liver ROS, GERD  ,  Endo/Other  diabetes, Type 2, Oral Hypoglycemic Agents  Renal/GU negative Renal ROS  negative genitourinary   Musculoskeletal  (+) Arthritis ,   Abdominal   Peds negative pediatric ROS (+)  Hematology negative hematology ROS (+)   Anesthesia Other Findings   Reproductive/Obstetrics negative OB ROS                            Anesthesia Physical Anesthesia Plan  ASA: II  Anesthesia Plan: General   Post-op Pain Management:    Induction: Intravenous  PONV Risk Score and Plan: 4 or greater and Ondansetron, Dexamethasone, Midazolam, Treatment may vary due to age or medical condition and Scopolamine patch - Pre-op  Airway Management Planned: LMA  Additional Equipment: None  Intra-op Plan:   Post-operative Plan: Extubation in OR  Informed Consent: I have reviewed the patients History and Physical, chart, labs and discussed the procedure including the risks, benefits and alternatives for the proposed anesthesia with the patient or authorized representative who has indicated his/her understanding and acceptance.     Dental advisory given  Plan Discussed with: CRNA  Anesthesia Plan Comments:         Anesthesia  Quick Evaluation

## 2018-11-16 NOTE — Discharge Instructions (Signed)
No Tylenol until 2:30 if needed No Ibuprofen until 4:00pm if needed  Endometrial Ablation, Care After This sheet gives you information about how to care for yourself after your procedure. Your health care provider may also give you more specific instructions. If you have problems or questions, contact your health care provider. What can I expect after the procedure? After the procedure, it is common to have:  A need to urinate more frequently than usual for the first 24 hours.  Cramps similar to menstrual cramps. These may last for 1-2 days.  Thin, watery vaginal discharge that is light pink or brown in color. This may last a few weeks. Discharge will be heavy for the first few days after your procedure. You may need to wear a sanitary pad.  Nausea.  Vaginal bleeding for 4-6 weeks after the procedure, as tissue healing occurs. Follow these instructions at home: Activity  Do not drive for 24 hours if you were given amedicine to help you relax (sedative) during your procedure.  Do not have sex or put anything into your vagina until your health care provider approves.  Do not lift anything that is heavier than 10 lb (4.5 kg), or the limit that you are told, until your health care provider says that it is safe.  Return to your normal activities as told by your health care provider. Ask your health care provider what activities are safe for you. General instructions   Take over-the-counter and prescription medicines only as told by your health care provider.  Do not take baths, swim, or use a hot tub until your health care provider approves. You will be able to take showers.  Check your vaginal area every day for signs of infection. Check for: ? Redness, swelling, or pain. ? More discharge or blood, instead of less. ? Bad-smelling discharge.  Keep all follow-up visits as told by your health care provider. This is important.  Drink enough fluid to keep your urine pale  yellow. Contact a health care provider if you have:  Vaginal redness, swelling, or pain.  Vaginal discharge or bleeding that gets worse instead of getting better.  Bad-smelling vaginal discharge.  A fever or chills.  Trouble urinating. Get help right away if you have:  Heavy vaginal bleeding.  Severe cramps. Summary  After endometrial ablation, it is normal to have thin, watery vaginal discharge that is light pink or brown in color. This may last a few weeks and may be heavier right after the procedure.  Vaginal bleeding is also normal after the procedure and should get better with time.  Check your vaginal area every day for signs of infection, such as bad-smelling discharge.  Keep all follow-up visits as told by your health care provider. This is important. This information is not intended to replace advice given to you by your health care provider. Make sure you discuss any questions you have with your health care provider. Document Released: 11/17/2016 Document Revised: 04/28/2018 Document Reviewed: 11/17/2016 Elsevier Patient Education  [place] Instructions  Activity: Get plenty of rest for the remainder of the day. A responsible individual must stay with you for 24 hours following the procedure.  For the next 24 hours, DO NOT: -Drive a car -Paediatric nurse -Drink alcoholic beverages -Take any medication unless instructed by your physician -Make any legal decisions or sign important papers.  Meals: Start with liquid foods such as gelatin or soup. Progress to regular foods as tolerated.  Avoid greasy, spicy, heavy foods. If nausea and/or vomiting occur, drink only clear liquids until the nausea and/or vomiting subsides. Call your physician if vomiting continues.  Special Instructions/Symptoms: Your throat may feel dry or sore from the anesthesia or the breathing tube placed in your throat during surgery. If this causes  discomfort, gargle with warm salt water. The discomfort should disappear within 24 hours.  If you had a scopolamine patch placed behind your ear for the management of post- operative nausea and/or vomiting:  1. The medication in the patch is effective for 72 hours, after which it should be removed.  Wrap patch in a tissue and discard in the trash. Wash hands thoroughly with soap and water. 2. You may remove the patch earlier than 72 hours if you experience unpleasant side effects which may include dry mouth, dizziness or visual disturbances. 3. Avoid touching the patch. Wash your hands with soap and water after contact with the patch.

## 2018-11-16 NOTE — Transfer of Care (Signed)
Immediate Anesthesia Transfer of Care Note  Patient: Sydney Powell  Procedure(s) Performed: DILATATION & CURETTAGE/HYSTEROSCOPY WITH HYDROTHERMAL ABLATION (N/A )  Patient Location: PACU  Anesthesia Type:General  Level of Consciousness: awake, alert  and oriented  Airway & Oxygen Therapy: Patient Spontanous Breathing and Patient connected to face mask oxygen  Post-op Assessment: Report given to RN and Post -op Vital signs reviewed and stable  Post vital signs: Reviewed and stable  Last Vitals:  Vitals Value Taken Time  BP 139/85 11/16/18 0955  Temp    Pulse 78 11/16/18 0957  Resp 14 11/16/18 0957  SpO2 100 % 11/16/18 0957  Vitals shown include unvalidated device data.  Last Pain:  Vitals:   11/16/18 0805  TempSrc: Oral  PainSc: 0-No pain      Patients Stated Pain Goal: 7 ([id number])  Complications: No apparent anesthesia complications

## 2018-11-16 NOTE — Op Note (Addendum)
Preoperative diagnosis: Menorrhagia  Postoperative diagnosis: Menorrhagia, submucosal fibroid, endometrial polyps  Procedure: IUD removal, HTA endometrial ablation, hysteroscopy, D & C  Surgeon: Standley Dakins. Kennon Rounds, M.D.  Anesthesia: GETT, paracervical block  Findings: Normal appearing cervix and uterine cavity large posterior fibroid with left ostia seen. Fibroid obscured fundus and right ostia.   Estimated blood loss: Minimal  Specimens: Endometrial curettings  Disposition of specimen: Pathology  Reason for procedure: Patient is a 48 y.o. G2P1011 with bleeding despite IUD for endometrial ablation.   Procedure: Patient was taken to the OR where she was placed in dorsal lithotomy in [place]. SCDs were in place. An adequate timeout was obtained. The patient was prepped and draped in the usual sterile fashion. A red rubber catheter is used to drain her bladder. A speculum was placed inside the vagina and the cervix visualized. The cervix was grasped anteriorly with a single-tooth tenaculum. IUD strings grasped and IUD removed intact. 20 cc of quarter percent Marcaine were injected for paracervical block. The uterus sounded to 10 cm. Sequential dilation was done to a #8 dilator, and the HTA with hysteroscope was introduced into the uterine cavity. The findings were as above. Decision made to ablate then Myosure removal of fibroid if needed. A seal test was done and was passed. The uterine cavity was heated to between 80 and 90C and HTA was performed for 10 minutes. Cooling was then performed. Hysteroscopy continued and the fibroid was shrunken considerably and no longer blocking the fundus and did not need to be removed. Sharp curettage was then performed and sample sent to pathology. All instrument, needle, and lap counts were correct x2. The patient was awakened taken to recovery room in stable condition.  Donnamae Jude MD 11/16/2018 9:47 AM

## 2018-11-16 NOTE — Anesthesia Procedure Notes (Signed)
Procedure Name: LMA Insertion Date/Time: 11/16/2018 9:06 AM Performed by: [place] Demetric Dunnaway C, CRNA Pre-anesthesia Checklist: Patient identified, Emergency Drugs available, Suction available and Patient being monitored Patient Re-evaluated:Patient Re-evaluated prior to induction Oxygen Delivery Method: Circle system utilized Preoxygenation: Pre-oxygenation with 100% oxygen Induction Type: IV induction Ventilation: Mask ventilation without difficulty LMA: LMA inserted LMA Size: 4.0 Number of attempts: 1 Airway Equipment and Method: Bite block Placement Confirmation: positive ETCO2 Tube secured with: Tape Dental Injury: Teeth and Oropharynx as per pre-operative assessment

## 2018-11-16 NOTE — Anesthesia Postprocedure Evaluation (Signed)
Anesthesia Post Note  Patient: Sydney Powell  Procedure(s) Performed: DILATATION & CURETTAGE/HYSTEROSCOPY WITH HYDROTHERMAL ABLATION (N/A )     Patient location during evaluation: PACU Anesthesia Type: General Level of consciousness: sedated and patient cooperative Pain management: pain level controlled Vital Signs Assessment: post-procedure vital signs reviewed and stable Respiratory status: spontaneous breathing Cardiovascular status: stable Anesthetic complications: no    Last Vitals:  Vitals:   11/16/18 1030 11/16/18 1120  BP: (!) 148/87 (!) 160/68  Pulse: 65 66  Resp: (!) 8 18  Temp:  37.2 C  SpO2: 98% 98%    Last Pain:  Vitals:   11/16/18 1120  TempSrc:   PainSc: 2                  Nolon Nations

## 2018-11-16 NOTE — Telephone Encounter (Signed)
Left message to call [email or web address] to schedule postop appointment with Dr Kennon Rounds in 2 weeks

## 2018-11-16 NOTE — Interval H&P Note (Signed)
History and Physical Interval Note:  11/16/2018 8:26 AM  Sydney Powell  has presented today for surgery, with the diagnosis of AUB.  The various methods of treatment have been discussed with the patient and family. After consideration of risks, benefits and other options for treatment, the patient has consented to  Procedure(s) with comments: DILATATION & CURETTAGE/HYSTEROSCOPY WITH HYDROTHERMAL ABLATION (N/A) - HTA rep will be here confirmed on 11/09/18 CS as a surgical intervention.  The patient's history has been reviewed, patient examined, no change in status, stable for surgery.  I have reviewed the patient's chart and labs.  Questions were answered to the patient's satisfaction.     Donnamae Jude

## 2018-11-17 ENCOUNTER — Encounter (HOSPITAL_BASED_OUTPATIENT_CLINIC_OR_DEPARTMENT_OTHER): Payer: Self-pay | Admitting: Family Medicine

## 2018-11-21 LAB — SURGICAL PATHOLOGY

## 2018-12-05 ENCOUNTER — Encounter: Payer: Self-pay | Admitting: Family Medicine

## 2018-12-05 ENCOUNTER — Other Ambulatory Visit: Payer: Self-pay

## 2018-12-05 ENCOUNTER — Ambulatory Visit (INDEPENDENT_AMBULATORY_CARE_PROVIDER_SITE_OTHER): Payer: 59 | Admitting: Family Medicine

## 2018-12-05 VITALS — BP 157/92 | HR 91 | Ht 67.0 in | Wt 205.0 lb

## 2018-12-05 DIAGNOSIS — Z9889 Other specified postprocedural states: Secondary | ICD-10-CM

## 2018-12-05 DIAGNOSIS — Z09 Encounter for follow-up examination after completed treatment for conditions other than malignant neoplasm: Secondary | ICD-10-CM

## 2018-12-05 DIAGNOSIS — N921 Excessive and frequent menstruation with irregular cycle: Secondary | ICD-10-CM | POA: Diagnosis not present

## 2018-12-05 NOTE — Assessment & Plan Note (Signed)
Noted to have large submucosal fibroid, which shrank dramatically following ablation--reviewed why this likely caused her bleeding, and normal etiology, physiology of fibroids.

## 2018-12-05 NOTE — Progress Notes (Signed)
   Subjective:    Patient ID: Sydney Powell is a 48 y.o. female presenting with Routine Post Op  on 12/05/2018  HPI: Here today to f/u after endometrial ablation. She is doing well. Minimal discharge, no bleeding.  Review of Systems  Constitutional: Negative for chills and fever.  Respiratory: Negative for shortness of breath.   Cardiovascular: Negative for chest pain.  Gastrointestinal: Negative for abdominal pain, nausea and vomiting.  Genitourinary: Negative for dysuria.  Skin: Negative for rash.      Objective:    BP (!) 157/92   Pulse 91   Ht 5' 7"  (1.702 m)   Wt 205 lb (93 kg)   BMI 32.11 kg/m  Physical Exam Constitutional:      General: She is not in acute distress.    Appearance: She is well-developed.  HENT:     Head: Normocephalic and atraumatic.  Eyes:     General: No scleral icterus. Neck:     Musculoskeletal: Neck supple.  Cardiovascular:     Rate and Rhythm: Normal rate.  Pulmonary:     Effort: Pulmonary effort is normal.  Abdominal:     Palpations: Abdomen is soft.  Skin:    General: Skin is warm and dry.  Neurological:     Mental Status: She is alert and oriented to person, place, and time.         Assessment & Plan:   Problem List Items Addressed This Visit      Unprioritized   Menorrhagia with irregular cycle    Noted to have large submucosal fibroid, which shrank dramatically following ablation--reviewed why this likely caused her bleeding, and normal etiology, physiology of fibroids.       Other Visit Diagnoses    Postop check    -  Primary   Doing well. f/u prn. Pathology reviewed. Photos reviewed.      Return if symptoms worsen or fail to improve.  Sydney Powell 12/05/2018 1:23 PM

## 2019-02-09 ENCOUNTER — Encounter: Payer: Self-pay | Admitting: Radiology

## 2019-02-16 ENCOUNTER — Other Ambulatory Visit
Admission: RE | Admit: 2019-02-16 | Discharge: 2019-02-16 | Disposition: A | Discharge status: Home / Self Care | Payer: 59 | Source: Ambulatory Visit | Attending: Student | Admitting: Student

## 2019-02-16 DIAGNOSIS — R197 Diarrhea, unspecified: Secondary | ICD-10-CM | POA: Insufficient documentation

## 2019-02-16 LAB — C DIFFICILE QUICK SCREEN W PCR REFLEX
C Diff antigen: NEGATIVE
C Diff interpretation: NOT DETECTED
C Diff toxin: NEGATIVE

## 2019-02-20 LAB — STOOL CULTURE REFLEX - CMPCXR

## 2019-02-20 LAB — STOOL CULTURE: E coli, Shiga toxin Assay: NEGATIVE

## 2019-02-20 LAB — STOOL CULTURE REFLEX - RSASHR

## 2019-02-22 LAB — CALPROTECTIN, FECAL: Calprotectin, Fecal: 37 ug/g (ref 0–120)

## 2019-07-05 ENCOUNTER — Encounter: Payer: Self-pay | Admitting: Radiology

## 2019-12-29 ENCOUNTER — Other Ambulatory Visit
Admission: RE | Admit: 2019-12-29 | Discharge: 2019-12-29 | Disposition: A | Discharge status: Home / Self Care | Payer: 59 | Source: Ambulatory Visit | Attending: Gastroenterology | Admitting: Gastroenterology

## 2019-12-29 DIAGNOSIS — K5010 Crohn's disease of large intestine without complications: Secondary | ICD-10-CM | POA: Insufficient documentation

## 2019-12-29 LAB — GASTROINTESTINAL PANEL BY PCR, STOOL (REPLACES STOOL CULTURE)

## 2019-12-29 LAB — C DIFFICILE QUICK SCREEN W PCR REFLEX
C Diff antigen: NEGATIVE
C Diff interpretation: NOT DETECTED
C Diff toxin: NEGATIVE

## 2020-01-01 LAB — CALPROTECTIN, FECAL: Calprotectin, Fecal: 33 ug/g (ref 0–120)

## 2020-02-07 ENCOUNTER — Other Ambulatory Visit: Payer: Self-pay | Admitting: Gastroenterology

## 2020-02-07 ENCOUNTER — Other Ambulatory Visit
Admission: RE | Admit: 2020-02-07 | Discharge: 2020-02-07 | Disposition: A | Discharge status: Home / Self Care | Payer: 59 | Source: Ambulatory Visit | Attending: Gastroenterology | Admitting: Gastroenterology

## 2020-02-07 DIAGNOSIS — K50019 Crohn's disease of small intestine with unspecified complications: Secondary | ICD-10-CM

## 2020-02-07 DIAGNOSIS — K5000 Crohn's disease of small intestine without complications: Secondary | ICD-10-CM | POA: Diagnosis present

## 2020-02-07 LAB — GASTROINTESTINAL PANEL BY PCR, STOOL (REPLACES STOOL CULTURE)

## 2020-02-07 LAB — C DIFFICILE QUICK SCREEN W PCR REFLEX
C Diff antigen: NEGATIVE
C Diff interpretation: NOT DETECTED
C Diff toxin: NEGATIVE

## 2020-02-10 LAB — CALPROTECTIN, FECAL: Calprotectin, Fecal: <16 ug/g (ref 0–120)

## 2020-02-20 ENCOUNTER — Other Ambulatory Visit: Payer: 59

## 2020-02-28 ENCOUNTER — Other Ambulatory Visit: Payer: Self-pay

## 2020-02-28 ENCOUNTER — Ambulatory Visit
Admission: RE | Admit: 2020-02-28 | Discharge: 2020-02-28 | Disposition: A | Discharge status: Home / Self Care | Payer: 59 | Source: Ambulatory Visit | Attending: Gastroenterology | Admitting: Gastroenterology

## 2020-02-28 DIAGNOSIS — K50019 Crohn's disease of small intestine with unspecified complications: Secondary | ICD-10-CM | POA: Insufficient documentation

## 2020-02-28 LAB — POCT I-STAT CREATININE: Creatinine, Ser: 0.7 mg/dL (ref 0.44–1.00)

## 2020-02-28 MED ORDER — IOHEXOL 300 MG/ML  SOLN
100.0000 mL | Freq: Once | INTRAMUSCULAR | Status: AC | PRN
  Administered 2020-02-28: 100 mL via INTRAVENOUS

## 2020-03-08 ENCOUNTER — Other Ambulatory Visit
Admission: RE | Admit: 2020-03-08 | Discharge: 2020-03-08 | Disposition: A | Discharge status: Home / Self Care | Payer: 59 | Source: Ambulatory Visit | Attending: Gastroenterology | Admitting: Gastroenterology

## 2020-03-08 DIAGNOSIS — R1031 Right lower quadrant pain: Secondary | ICD-10-CM | POA: Insufficient documentation

## 2020-03-08 DIAGNOSIS — R197 Diarrhea, unspecified: Secondary | ICD-10-CM | POA: Diagnosis present

## 2020-03-08 LAB — GASTROINTESTINAL PANEL BY PCR, STOOL (REPLACES STOOL CULTURE)

## 2020-03-08 LAB — C DIFFICILE QUICK SCREEN W PCR REFLEX
C Diff antigen: NEGATIVE
C Diff interpretation: NOT DETECTED
C Diff toxin: NEGATIVE

## 2020-11-20 IMAGING — CT CT ABD-PELV W/ CM
2 of 5 series · 16 of 46 positions shown, 18 images · IV contrast (iopamidol)
Comparison: CT scan of December 22, 2012.

CLINICAL DATA: Acute generalized abdominal pain, nausea, diarrhea.
History of Crohn's disease.

EXAM:
CT ABDOMEN AND PELVIS WITH CONTRAST
TECHNIQUE: Multidetector CT imaging of the abdomen and pelvis was performed
using the standard protocol following bolus administration of
intravenous contrast.
CONTRAST:  100mL 69656I-PPP IOPAMIDOL (69656I-PPP) INJECTION 61%

[Series 2: abd pelvis · axial · 0.77mm/px · z∈[-1553,-1133]mm · 13 of 94 slices shown, 15 images (1 of 2)]
[im 5/94  soft-tissue]
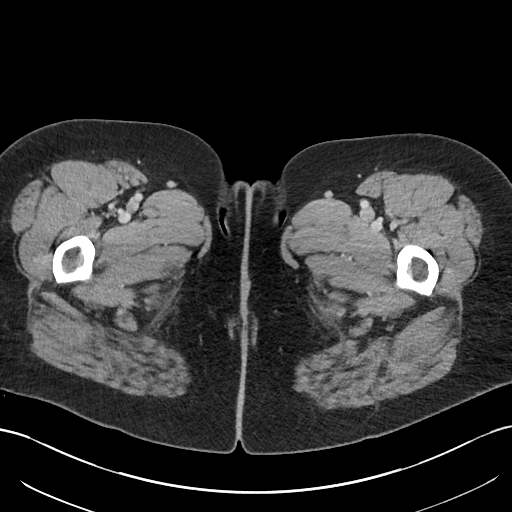
[im 5/94  bone]
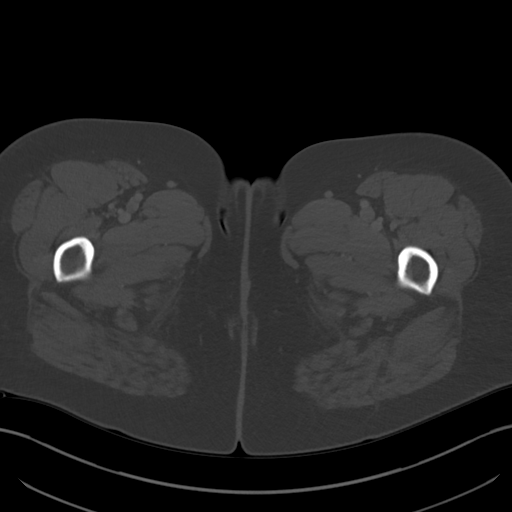
[im 15/94  soft-tissue]
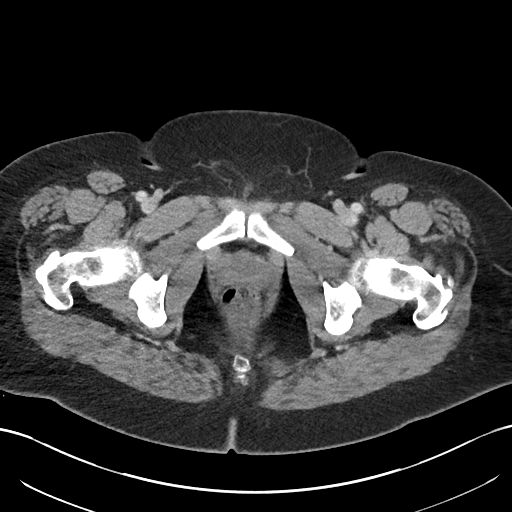
[im 20/94  soft-tissue]
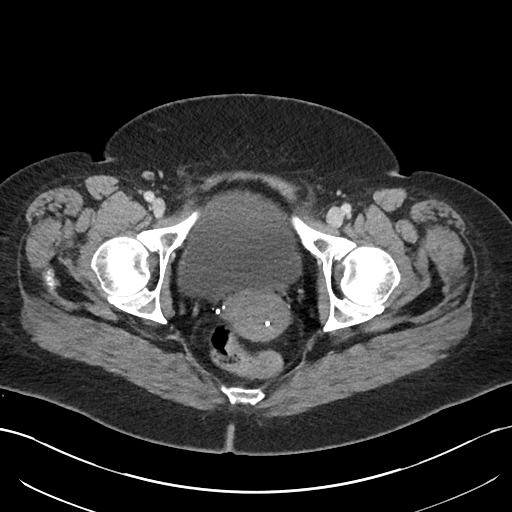
[im 25/94  soft-tissue]
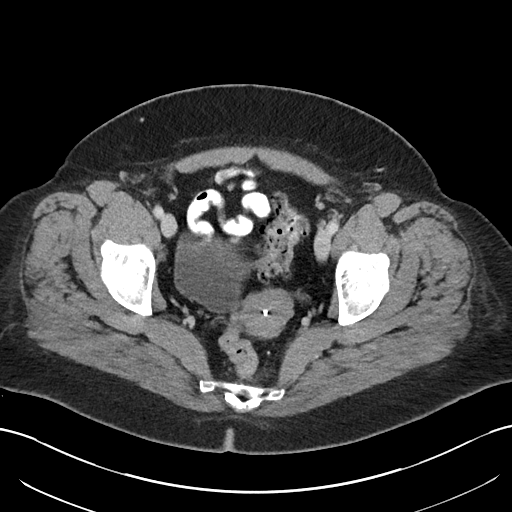
[im 35/94  soft-tissue]
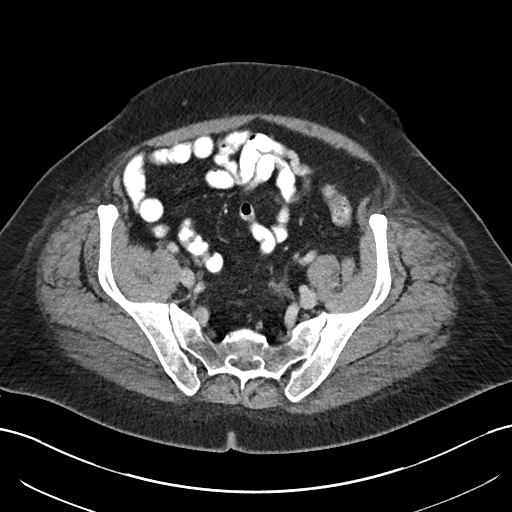
[im 40/94  soft-tissue]
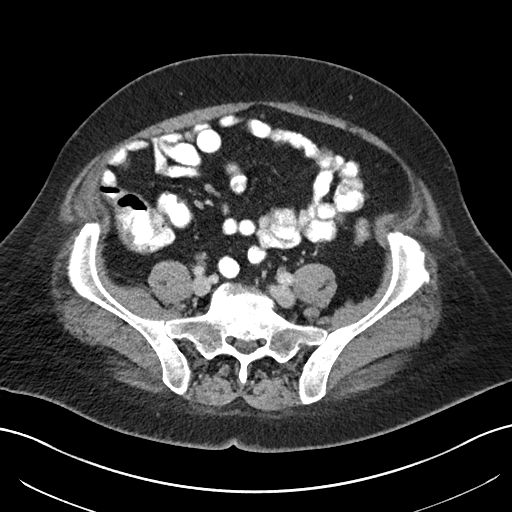
[im 49/94  soft-tissue]
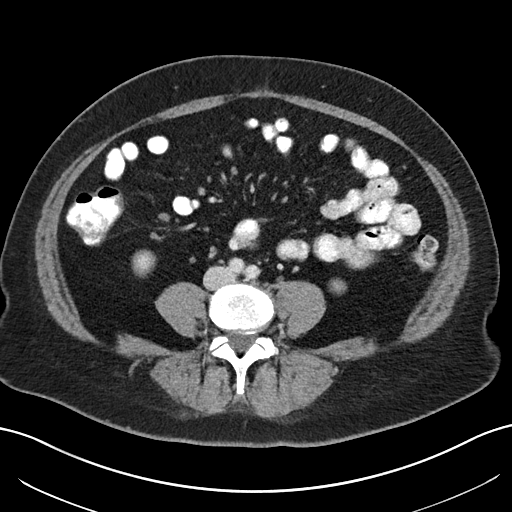
[im 54/94  soft-tissue]
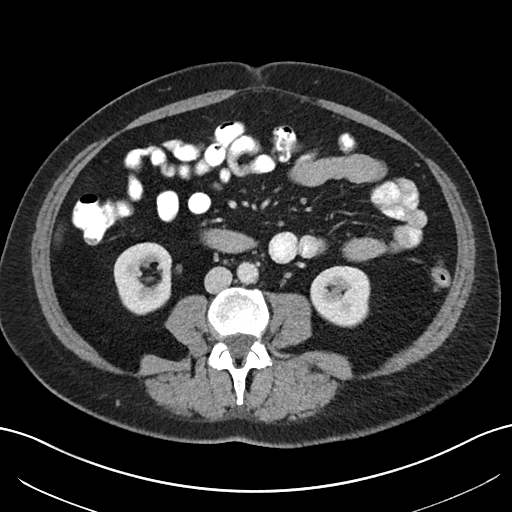
[im 59/94  soft-tissue]
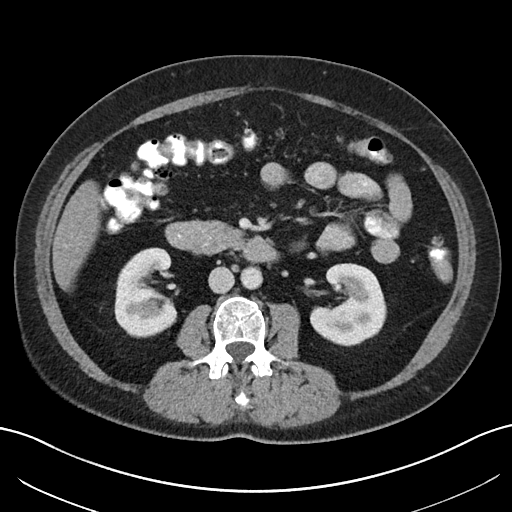
[im 59/94  bone]
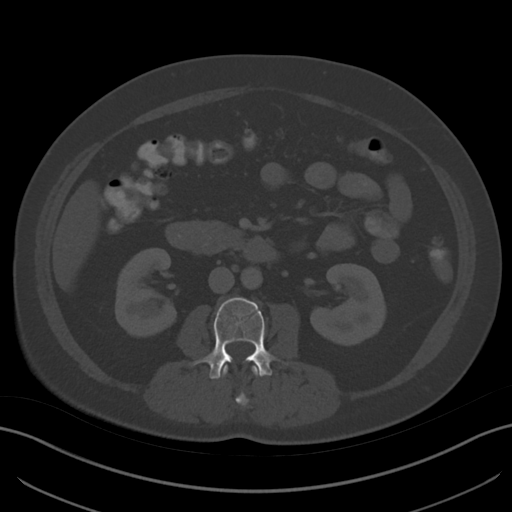
[im 69/94  soft-tissue]
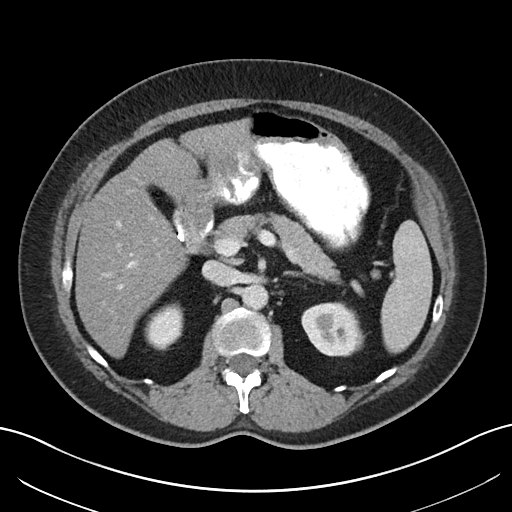
[im 74/94  soft-tissue]
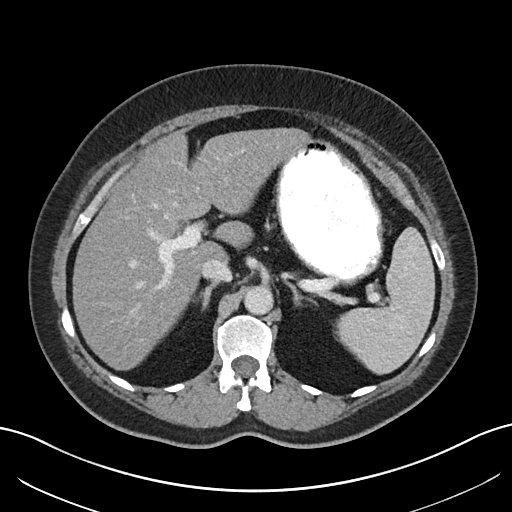
[im 79/94  soft-tissue]
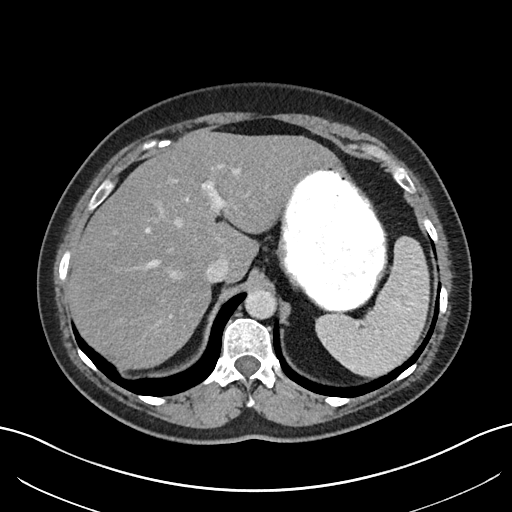
[im 89/94  soft-tissue]
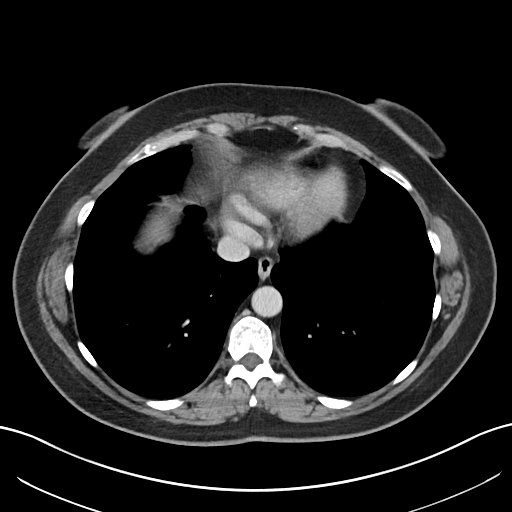

[Series 4: abd pelvis · coronal · 0.77mm/px · 3 of 163 slices shown (2 of 2)]
[im 55/163  soft-tissue]
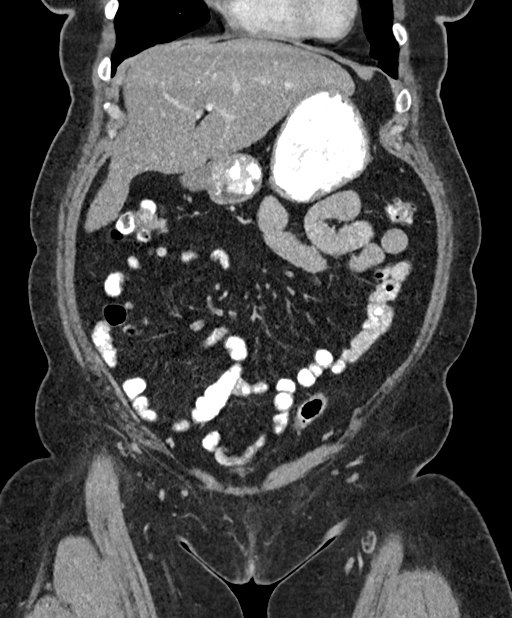
[im 73/163  soft-tissue]
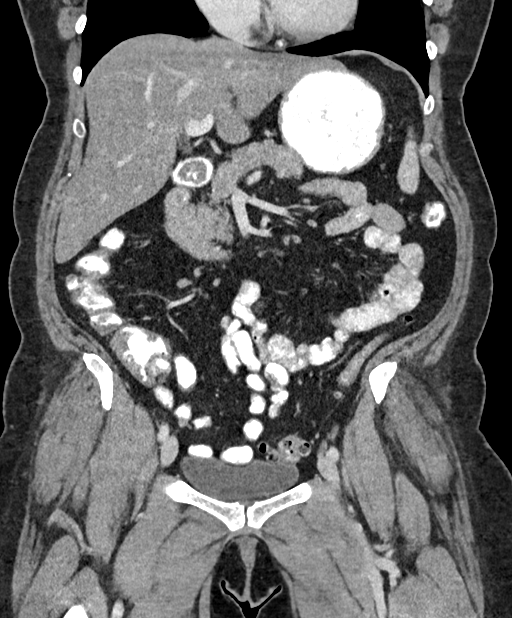
[im 91/163  soft-tissue]
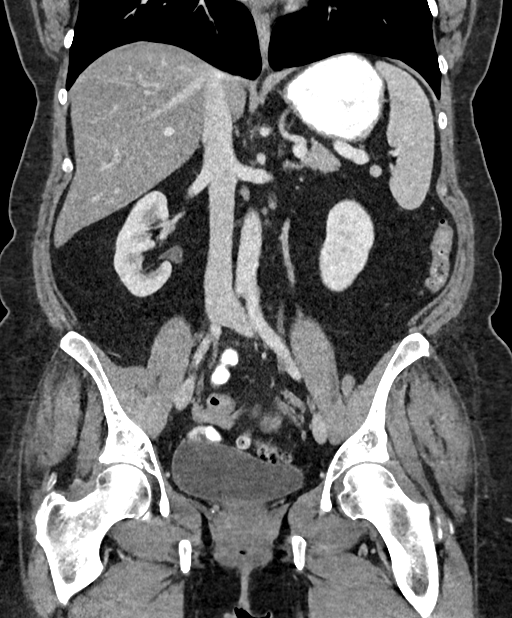

[16 of 46 positions shown; findings below may reference images not displayed]

FINDINGS: Lower chest: No acute abnormality.

Hepatobiliary: No focal liver abnormality is seen. Status post
cholecystectomy. No biliary dilatation.

Pancreas: Unremarkable. No pancreatic ductal dilatation or
surrounding inflammatory changes.

Spleen: Normal in size without focal abnormality.

Adrenals/Urinary Tract: Adrenal glands appear normal. Small
nonobstructive calculus seen in lower pole collecting system of left
kidney. No hydronephrosis or renal obstruction is noted. Urinary
bladder is unremarkable.

Stomach/Bowel: Stomach appears normal. Status post appendectomy.
Diverticulosis of descending and sigmoid colon is noted without
inflammation. There is no evidence of bowel obstruction. No acute
abnormality is seen involving the terminal ileum at this time.

Vascular/Lymphatic: No significant vascular findings are present. No
enlarged abdominal or pelvic lymph nodes.

Reproductive: Intrauterine device appears to be well positioned
within the uterus. No adnexal abnormality is noted.

Other: No abdominal wall hernia or abnormality. No abdominopelvic
ascites.

Musculoskeletal: No acute or significant osseous findings.
IMPRESSION: Diverticulosis of descending and sigmoid colon is noted without
inflammation. No definite evidence of acute bowel inflammation or
obstruction is noted currently.

Small nonobstructive left renal calculus. No hydronephrosis or renal
obstruction is noted.

## 2021-06-20 IMAGING — CT CT ENTEROGRAPHY (ABD-PELV W/ CM)
2 of 6 series · 16 of 46 positions shown, 18 images · IV contrast (omnipaque)
Comparison: 03/09/2018.

CLINICAL DATA: Crohn disease with flare ups.

EXAM:
CT ABDOMEN AND PELVIS WITH CONTRAST (ENTEROGRAPHY)
TECHNIQUE: Multidetector CT of the abdomen and pelvis during bolus
administration of intravenous contrast. Negative oral contrast was
given.
CONTRAST:  100mL OMNIPAQUE IOHEXOL 300 MG/ML  SOLN

[Series 5: cor enterography · coronal · 0.74mm/px · 3 of 162 slices shown]
[im 54/162  soft-tissue]
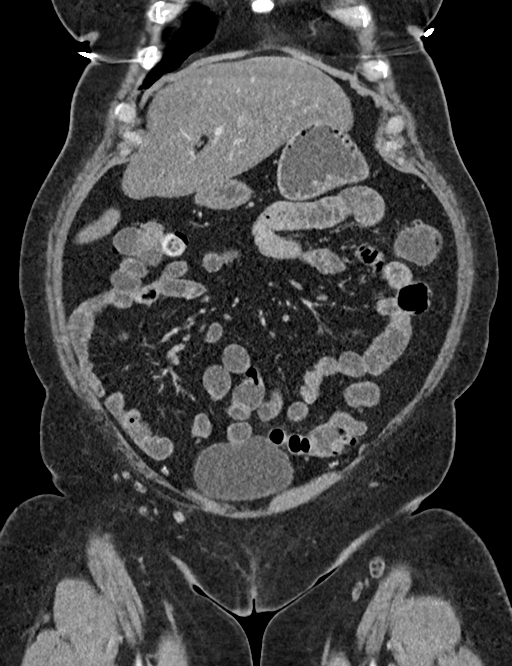
[im 72/162  soft-tissue]
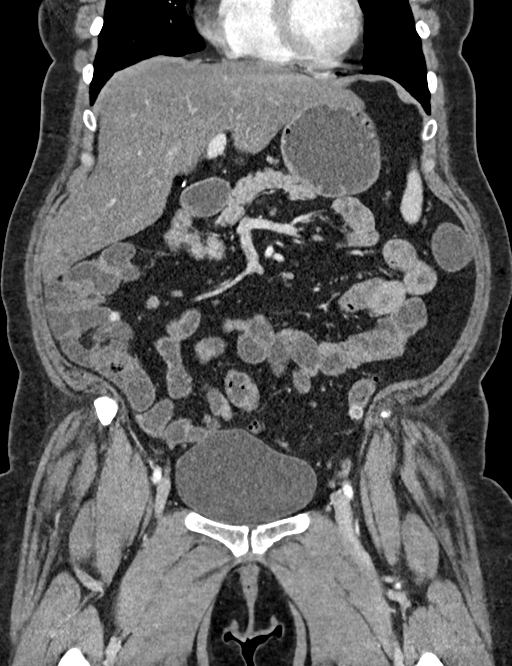
[im 90/162  soft-tissue]
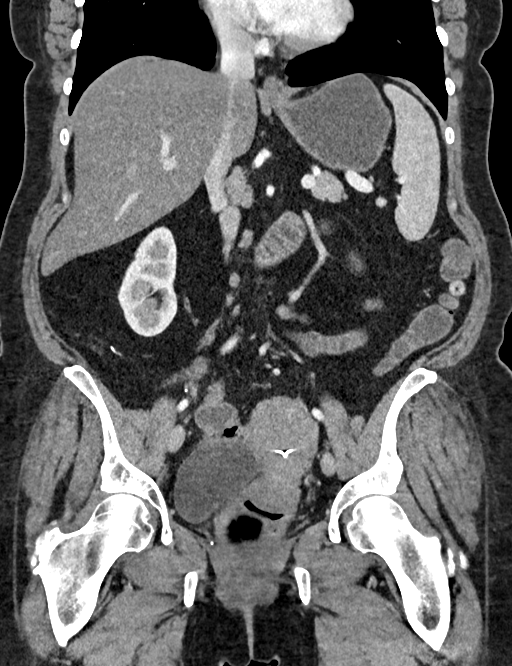

[Series 9: thins enterography · axial · 0.74mm/px · z∈[-1523,-1087]mm · 13 of 246 slices shown, 15 images]
[im 14/246  soft-tissue]
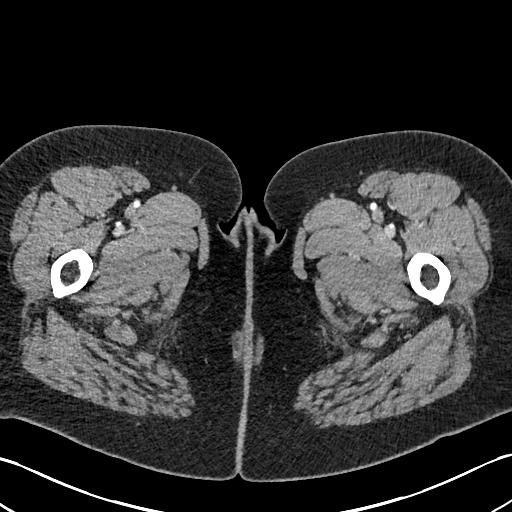
[im 14/246  bone]
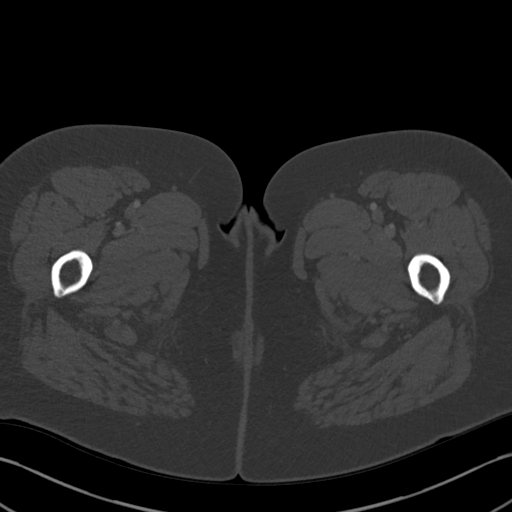
[im 28/246  soft-tissue]
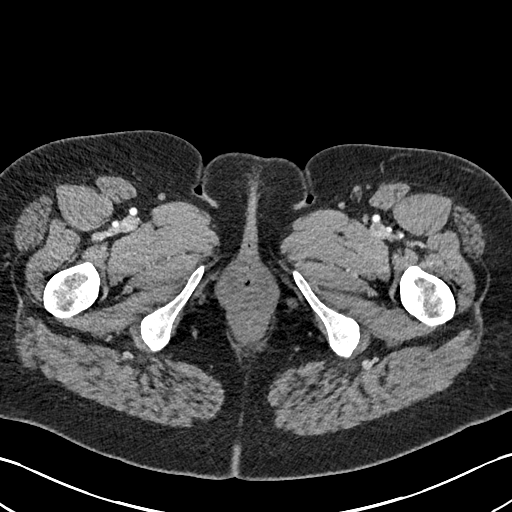
[im 55/246  soft-tissue]
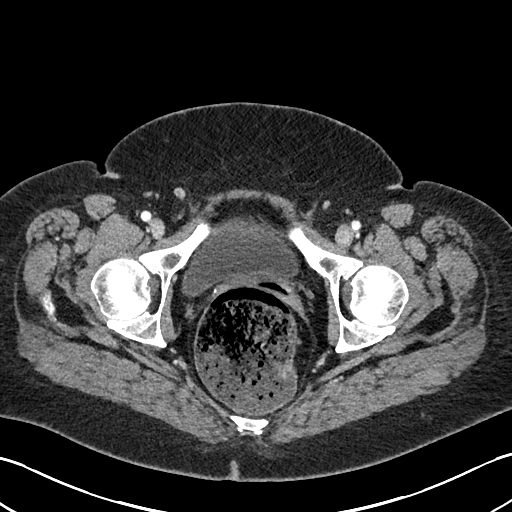
[im 69/246  soft-tissue]
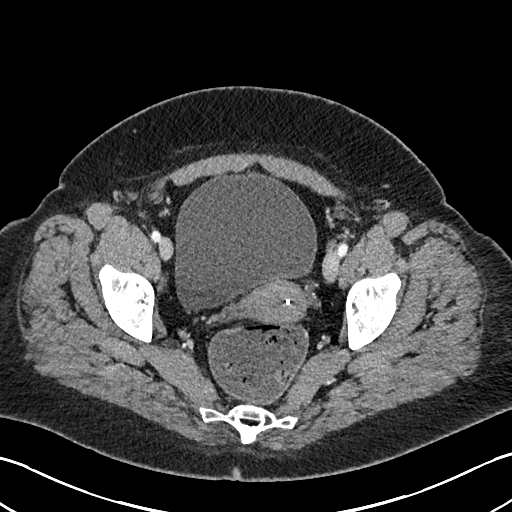
[im 82/246  soft-tissue]
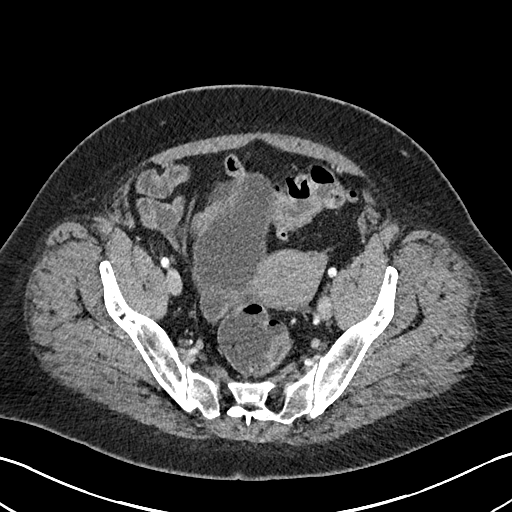
[im 109/246  soft-tissue]
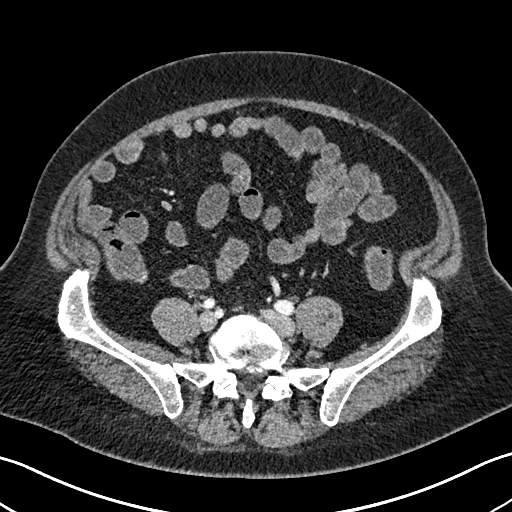
[im 123/246  soft-tissue]
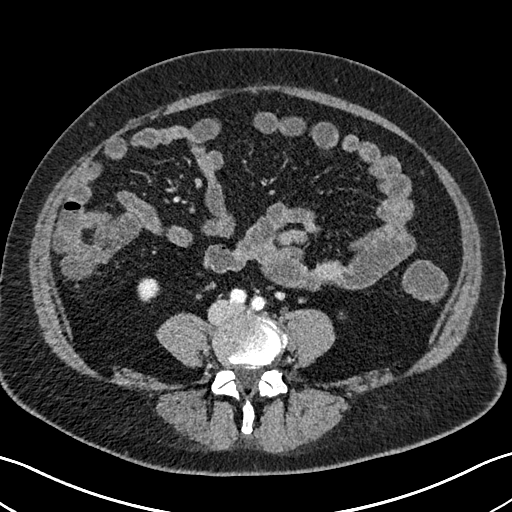
[im 137/246  soft-tissue]
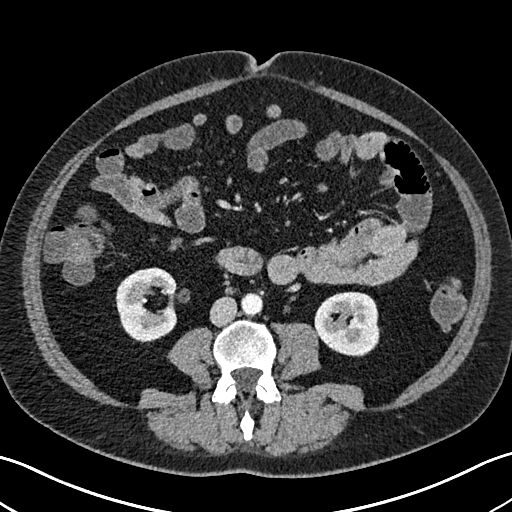
[im 164/246  soft-tissue]
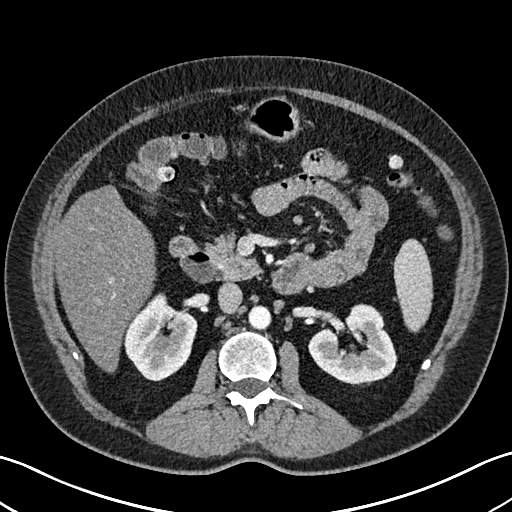
[im 164/246  bone]
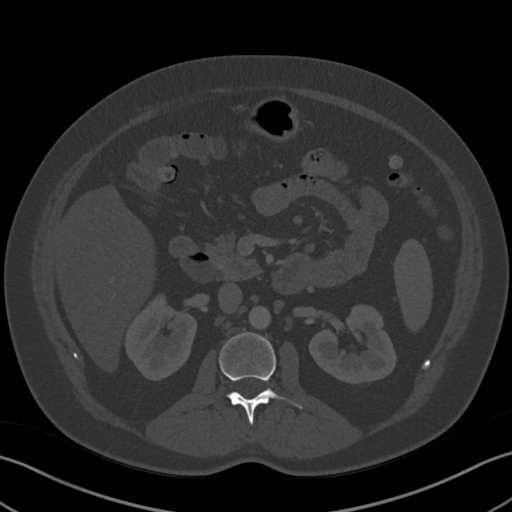
[im 177/246  soft-tissue]
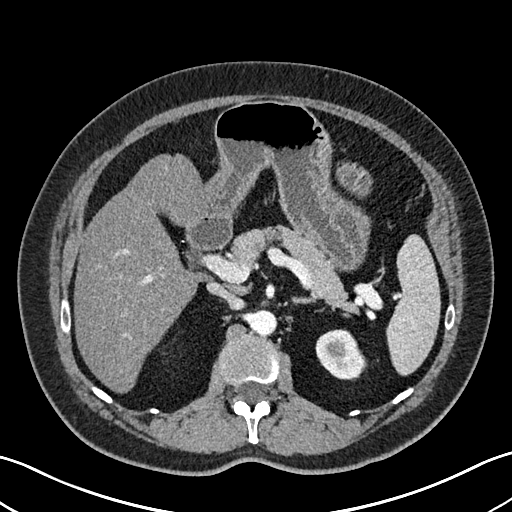
[im 191/246  soft-tissue]
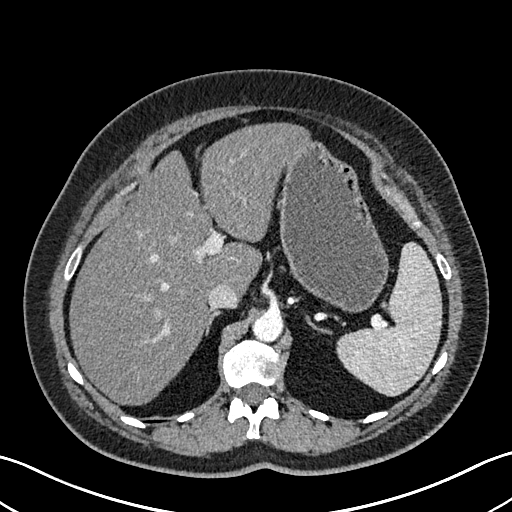
[im 218/246  soft-tissue]
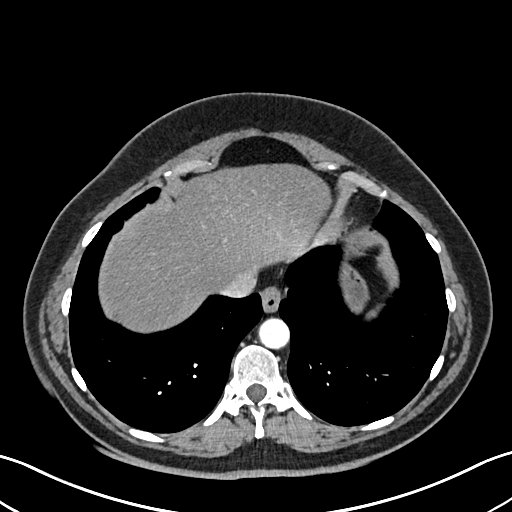
[im 232/246  soft-tissue]
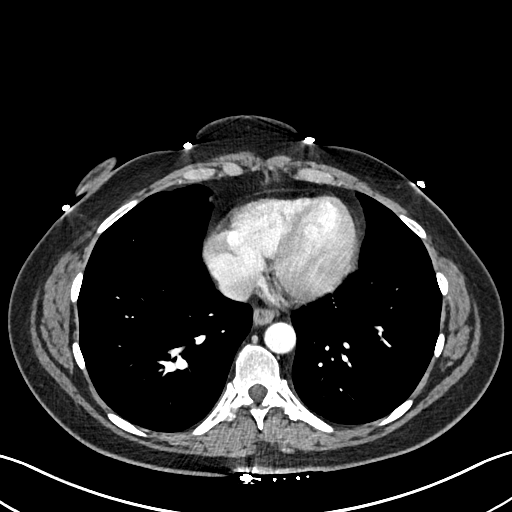

[16 of 46 positions shown; findings below may reference images not displayed]

FINDINGS: Lower chest: Lung bases are clear. Heart size normal. No pericardial
or pleural effusion. Distal esophagus is unremarkable.

Hepatobiliary: Liver is decreased in attenuation diffusely.
Cholecystectomy. No abnormal biliary ductal dilatation.

Pancreas: Negative.

Spleen: Negative.

Adrenals/Urinary Tract: Adrenal glands and right kidney are
unremarkable. Subcentimeter low-attenuation lesion in the lower pole
left kidney, likely a cyst. Ureters are decompressed. Bladder is
grossly unremarkable.

Stomach/Bowel: Stomach is unremarkable. Duodenal diverticulum is
incidentally noted. Small bowel and colon are unremarkable.
Appendectomy.

Vascular/Lymphatic: Vascular structures are unremarkable. Scattered
lymph nodes are not enlarged by CT size criteria.

Reproductive: Intrauterine contraceptive device is in place. No
adnexal mass.

Other: No free fluid.  Mesenteries and peritoneum are unremarkable.

Musculoskeletal: No worrisome lytic or sclerotic lesions. Sacroiliac
joints are patent. Degenerative disc disease at L5-S1.
IMPRESSION: 1. No evidence of active Crohn disease or postinflammatory
stricturing.
2. Hepatic steatosis.

## 2021-11-21 ENCOUNTER — Other Ambulatory Visit: Payer: Self-pay | Admitting: Gastroenterology

## 2021-11-21 DIAGNOSIS — R748 Abnormal levels of other serum enzymes: Secondary | ICD-10-CM

## 2022-01-06 ENCOUNTER — Other Ambulatory Visit: Payer: Self-pay | Admitting: Gastroenterology

## 2022-01-06 DIAGNOSIS — R1084 Generalized abdominal pain: Secondary | ICD-10-CM

## 2022-01-06 DIAGNOSIS — R053 Chronic cough: Secondary | ICD-10-CM

## 2022-01-06 DIAGNOSIS — R509 Fever, unspecified: Secondary | ICD-10-CM

## 2022-01-06 DIAGNOSIS — N83202 Unspecified ovarian cyst, left side: Secondary | ICD-10-CM

## 2022-01-07 ENCOUNTER — Ambulatory Visit
Admission: RE | Admit: 2022-01-07 | Discharge: 2022-01-07 | Disposition: A | Discharge status: Home / Self Care | Payer: No Typology Code available for payment source | Source: Ambulatory Visit | Attending: Gastroenterology | Admitting: Gastroenterology

## 2022-01-07 DIAGNOSIS — R748 Abnormal levels of other serum enzymes: Secondary | ICD-10-CM | POA: Insufficient documentation

## 2022-01-13 ENCOUNTER — Ambulatory Visit: Admission: RE | Admit: 2022-01-13 | Payer: No Typology Code available for payment source | Source: Ambulatory Visit

## 2022-02-16 ENCOUNTER — Other Ambulatory Visit: Payer: Self-pay | Admitting: Gastroenterology

## 2022-02-16 DIAGNOSIS — R1084 Generalized abdominal pain: Secondary | ICD-10-CM

## 2022-02-16 DIAGNOSIS — R053 Chronic cough: Secondary | ICD-10-CM

## 2022-02-16 DIAGNOSIS — R509 Fever, unspecified: Secondary | ICD-10-CM

## 2022-02-16 DIAGNOSIS — N83202 Unspecified ovarian cyst, left side: Secondary | ICD-10-CM

## 2022-02-23 ENCOUNTER — Encounter: Payer: Self-pay | Admitting: Gastroenterology

## 2022-02-24 ENCOUNTER — Ambulatory Visit
Admission: RE | Admit: 2022-02-24 | Discharge: 2022-02-24 | Disposition: A | Discharge status: Home / Self Care | Payer: 59 | Source: Ambulatory Visit | Attending: Gastroenterology | Admitting: Gastroenterology

## 2022-02-24 DIAGNOSIS — R1084 Generalized abdominal pain: Secondary | ICD-10-CM

## 2022-02-24 DIAGNOSIS — R509 Fever, unspecified: Secondary | ICD-10-CM

## 2022-02-24 DIAGNOSIS — R053 Chronic cough: Secondary | ICD-10-CM

## 2022-02-24 DIAGNOSIS — N83202 Unspecified ovarian cyst, left side: Secondary | ICD-10-CM

## 2022-02-24 MED ORDER — IOPAMIDOL (ISOVUE-300) INJECTION 61%
100.0000 mL | Freq: Once | INTRAVENOUS | Status: AC | PRN
  Administered 2022-02-24: 100 mL via INTRAVENOUS

## 2022-07-07 ENCOUNTER — Encounter: Payer: Self-pay | Admitting: Gastroenterology

## 2022-07-07 DIAGNOSIS — R748 Abnormal levels of other serum enzymes: Secondary | ICD-10-CM

## 2022-07-08 ENCOUNTER — Other Ambulatory Visit: Payer: Self-pay | Admitting: Gastroenterology

## 2022-07-08 DIAGNOSIS — K5000 Crohn's disease of small intestine without complications: Secondary | ICD-10-CM

## 2022-07-08 DIAGNOSIS — R748 Abnormal levels of other serum enzymes: Secondary | ICD-10-CM

## 2022-10-23 ENCOUNTER — Ambulatory Visit
Admission: RE | Admit: 2022-10-23 | Discharge: 2022-10-23 | Disposition: A | Discharge status: Home / Self Care | Payer: 59 | Source: Ambulatory Visit | Attending: Gastroenterology | Admitting: Gastroenterology

## 2022-10-23 ENCOUNTER — Other Ambulatory Visit: Payer: Self-pay | Admitting: Gastroenterology

## 2022-10-23 DIAGNOSIS — R1013 Epigastric pain: Secondary | ICD-10-CM

## 2022-10-27 ENCOUNTER — Other Ambulatory Visit: Payer: Self-pay | Admitting: Gastroenterology

## 2022-10-27 DIAGNOSIS — R1013 Epigastric pain: Secondary | ICD-10-CM

## 2022-10-29 ENCOUNTER — Ambulatory Visit
Admission: RE | Admit: 2022-10-29 | Discharge: 2022-10-29 | Disposition: A | Discharge status: Home / Self Care | Payer: 59 | Source: Ambulatory Visit | Attending: Gastroenterology | Admitting: Gastroenterology

## 2022-10-29 DIAGNOSIS — R1013 Epigastric pain: Secondary | ICD-10-CM | POA: Diagnosis present
